# Patient Record
Sex: Male | Born: 1950 | Marital: Married | Smoking: Former smoker
Health system: Southern US, Community
[De-identification: ages and names within clinical notes are randomized; demographics above are authoritative.]

## PROBLEM LIST (undated history)

## (undated) DIAGNOSIS — N182 Chronic kidney disease, stage 2 (mild): Secondary | ICD-10-CM

## (undated) DIAGNOSIS — K420 Umbilical hernia with obstruction, without gangrene: Secondary | ICD-10-CM

## (undated) DIAGNOSIS — I10 Essential (primary) hypertension: Secondary | ICD-10-CM

## (undated) DIAGNOSIS — E119 Type 2 diabetes mellitus without complications: Secondary | ICD-10-CM

## (undated) DIAGNOSIS — J45909 Unspecified asthma, uncomplicated: Secondary | ICD-10-CM

## (undated) HISTORY — PX: INGUINAL HERNIA REPAIR: SUR1180

## (undated) HISTORY — DX: Chronic kidney disease, stage 2 (mild): N18.2

## (undated) HISTORY — DX: Umbilical hernia with obstruction, without gangrene: K42.0

## (undated) HISTORY — PX: HERNIA REPAIR: SHX51

## (undated) HISTORY — DX: Type 2 diabetes mellitus without complications: E11.9

## (undated) HISTORY — DX: Essential (primary) hypertension: I10

---

## 2006-02-03 ENCOUNTER — Inpatient Hospital Stay: Payer: Self-pay | Admitting: Internal Medicine

## 2006-02-03 ENCOUNTER — Other Ambulatory Visit: Payer: Self-pay

## 2011-02-04 ENCOUNTER — Emergency Department: Payer: Self-pay | Admitting: Emergency Medicine

## 2013-10-08 ENCOUNTER — Emergency Department: Payer: Self-pay | Admitting: Emergency Medicine

## 2014-11-13 ENCOUNTER — Emergency Department: Payer: Self-pay | Admitting: Emergency Medicine

## 2014-11-20 ENCOUNTER — Ambulatory Visit: Payer: Self-pay | Admitting: Podiatry

## 2015-05-23 ENCOUNTER — Other Ambulatory Visit: Payer: Self-pay | Admitting: Internal Medicine

## 2015-05-23 DIAGNOSIS — M25561 Pain in right knee: Secondary | ICD-10-CM

## 2015-05-25 ENCOUNTER — Ambulatory Visit
Admission: RE | Admit: 2015-05-25 | Discharge: 2015-05-25 | Disposition: A | Discharge status: Home / Self Care | Payer: No Typology Code available for payment source | Source: Ambulatory Visit | Attending: Internal Medicine | Admitting: Internal Medicine

## 2015-05-25 DIAGNOSIS — M25561 Pain in right knee: Secondary | ICD-10-CM

## 2015-09-04 ENCOUNTER — Telehealth: Payer: Self-pay | Admitting: Gastroenterology

## 2015-09-04 NOTE — Telephone Encounter (Signed)
Colonoscopy triage °

## 2015-09-07 NOTE — Telephone Encounter (Signed)
Phoned patient to schedule colonoscopy, no answer left message to contact office

## 2015-09-11 NOTE — Telephone Encounter (Signed)
Cant reach patient, mailed letter for patient to contact office

## 2015-09-18 ENCOUNTER — Telehealth: Payer: Self-pay | Admitting: Gastroenterology

## 2015-09-18 ENCOUNTER — Other Ambulatory Visit: Payer: Self-pay

## 2015-09-18 NOTE — Telephone Encounter (Signed)
Gastroenterology Pre-Procedure Review  Request Date: 10-09-2015 Requesting Physician: Dr.   PATIENT REVIEW QUESTIONS: The patient responded to the following health history questions as indicated:    1. Are you having any GI issues? no 2. Do you have a personal history of Polyps? no 3. Do you have a family history of Colon Cancer or Polyps? no 4. Diabetes Mellitus? no 5. Joint replacements in the past 12 months?no 6. Major health problems in the past 3 months?no 7. Any artificial heart valves, MVP, or defibrillator?no    MEDICATIONS & ALLERGIES:    Patient reports the following regarding taking any anticoagulation/antiplatelet therapy:   Plavix, Coumadin, Eliquis, Xarelto, Lovenox, Pradaxa, Brilinta, or Effient? no Aspirin? YES  Patient confirms/reports the following medications:  No current outpatient prescriptions on file.   No current facility-administered medications for this visit.    Patient confirms/reports the following allergies:  Allergies not on file  No orders of the defined types were placed in this encounter.    AUTHORIZATION INFORMATION Primary Insurance: 1D#: Group #:  Secondary Insurance: 1D#: Group #:  SCHEDULE INFORMATION: Date: 10-09-2015 Time: Location:[facility]

## 2015-09-19 ENCOUNTER — Other Ambulatory Visit: Payer: Self-pay | Admitting: Internal Medicine

## 2015-09-19 ENCOUNTER — Ambulatory Visit
Admission: RE | Admit: 2015-09-19 | Discharge: 2015-09-19 | Disposition: A | Discharge status: Home / Self Care | Payer: No Typology Code available for payment source | Source: Ambulatory Visit | Attending: Internal Medicine | Admitting: Internal Medicine

## 2015-09-19 DIAGNOSIS — M79622 Pain in left upper arm: Secondary | ICD-10-CM | POA: Diagnosis present

## 2015-09-19 DIAGNOSIS — R52 Pain, unspecified: Secondary | ICD-10-CM

## 2015-09-19 DIAGNOSIS — W19XXXA Unspecified fall, initial encounter: Secondary | ICD-10-CM | POA: Diagnosis not present

## 2015-09-19 DIAGNOSIS — M25512 Pain in left shoulder: Secondary | ICD-10-CM | POA: Insufficient documentation

## 2015-09-27 ENCOUNTER — Other Ambulatory Visit: Payer: Self-pay

## 2015-09-27 DIAGNOSIS — Z1211 Encounter for screening for malignant neoplasm of colon: Secondary | ICD-10-CM

## 2015-09-27 MED ORDER — PEG 3350-KCL-NA BICARB-NACL 420 G PO SOLR: 4000.0000 mL | ORAL | Status: DC

## 2015-10-09 ENCOUNTER — Encounter: Payer: Self-pay | Admitting: Anesthesiology

## 2015-10-09 ENCOUNTER — Encounter
Admission: RE | Disposition: A | Discharge status: Home / Self Care | Payer: Self-pay | Source: Ambulatory Visit | Attending: Gastroenterology

## 2015-10-09 ENCOUNTER — Ambulatory Visit: Payer: No Typology Code available for payment source | Admitting: Anesthesiology

## 2015-10-09 ENCOUNTER — Ambulatory Visit
Admission: RE | Admit: 2015-10-09 | Discharge: 2015-10-09 | Disposition: A | Discharge status: Home / Self Care | Payer: No Typology Code available for payment source | Source: Ambulatory Visit | Attending: Gastroenterology | Admitting: Gastroenterology

## 2015-10-09 DIAGNOSIS — Z87891 Personal history of nicotine dependence: Secondary | ICD-10-CM | POA: Diagnosis not present

## 2015-10-09 DIAGNOSIS — D125 Benign neoplasm of sigmoid colon: Secondary | ICD-10-CM | POA: Insufficient documentation

## 2015-10-09 DIAGNOSIS — K640 First degree hemorrhoids: Secondary | ICD-10-CM | POA: Diagnosis not present

## 2015-10-09 DIAGNOSIS — Z1211 Encounter for screening for malignant neoplasm of colon: Secondary | ICD-10-CM | POA: Insufficient documentation

## 2015-10-09 HISTORY — PX: COLONOSCOPY WITH PROPOFOL: SHX5780

## 2015-10-09 SURGERY — COLONOSCOPY WITH PROPOFOL
Anesthesia: General

## 2015-10-09 MED ORDER — SODIUM CHLORIDE 0.9 % IV SOLN
INTRAVENOUS | Status: DC
  Administered 2015-10-09: 1000 mL via INTRAVENOUS

## 2015-10-09 MED ORDER — MIDAZOLAM HCL 2 MG/2ML IJ SOLN
INTRAMUSCULAR | Status: DC | PRN
  Administered 2015-10-09: 2 mg via INTRAVENOUS

## 2015-10-09 MED ORDER — PROPOFOL 10 MG/ML IV BOLUS
INTRAVENOUS | Status: DC | PRN
  Administered 2015-10-09: 20 mg via INTRAVENOUS
  Administered 2015-10-09 (×2): 30 mg via INTRAVENOUS

## 2015-10-09 MED ORDER — PROPOFOL 500 MG/50ML IV EMUL
INTRAVENOUS | Status: DC | PRN
  Administered 2015-10-09: 150 ug/kg/min via INTRAVENOUS

## 2015-10-09 MED ORDER — LIDOCAINE HCL (CARDIAC) 20 MG/ML IV SOLN
INTRAVENOUS | Status: DC | PRN
  Administered 2015-10-09: 60 mg via INTRAVENOUS

## 2015-10-09 NOTE — Anesthesia Postprocedure Evaluation (Signed)
Anesthesia Post Note  Patient: Jason Hammond  Procedure(s) Performed: Procedure(s) (LRB): COLONOSCOPY WITH PROPOFOL (N/A)  Patient location during evaluation: Endoscopy Anesthesia Type: General Level of consciousness: awake Pain management: pain level controlled Respiratory status: spontaneous breathing Cardiovascular status: blood pressure returned to baseline Anesthetic complications: no    Last Vitals:  Filed Vitals:   10/09/15 1029 10/09/15 1039  BP: 133/93 151/96  Pulse: 81 79  Temp:    Resp: 19 25    Last Pain:  Filed Vitals:   10/09/15 1040  PainSc: 0-No pain                 Jason Hammond S

## 2015-10-09 NOTE — Op Note (Signed)
[facility]astroenterology Patient Name: Jason Hammond Procedure Date: 10/09/2015 9:46 AM MRN: [id number] Account #: [id number] Date of Birth: 1951-08-13 Admit Type: Outpatient Age: 64 Room: [facility] ENDO ROOM 4 Gender: Male Note Status: Finalized Procedure:         Colonoscopy Indications:       Screening for colorectal malignant neoplasm Providers:         Lucilla Lame, MD Referring MD:      Casilda Carls, MD (Referring MD) Medicines:         Propofol per Anesthesia Complications:     No immediate complications. Procedure:         Pre-Anesthesia Assessment:                    - Prior to the procedure, a History and Physical was                     performed, and patient medications and allergies were                     reviewed. The patient's tolerance of previous anesthesia                     was also reviewed. The risks and benefits of the procedure                     and the sedation options and risks were discussed with the                     patient. All questions were answered, and informed consent                     was obtained. Prior Anticoagulants: The patient has taken                     no previous anticoagulant or antiplatelet agents. ASA                     Grade Assessment: II - A patient with mild systemic                     disease. After reviewing the risks and benefits, the                     patient was deemed in satisfactory condition to undergo                     the procedure.                    After obtaining informed consent, the colonoscope was                     passed under direct vision. Throughout the procedure, the                     patient's blood pressure, pulse, and oxygen saturations                     were monitored continuously. The Colonoscope was                     introduced through the anus and advanced to the the cecum,  identified by appendiceal orifice and ileocecal valve. The               colonoscopy was performed without difficulty. The patient                     tolerated the procedure well. The quality of the bowel                     preparation was good. Findings:      The perianal and digital rectal examinations were normal.      A 7 mm polyp was found in the sigmoid colon. The polyp was sessile. The       polyp was removed with a cold snare. Resection and retrieval were       complete.      Non-bleeding internal hemorrhoids were found during retroflexion. The       hemorrhoids were Grade II (internal hemorrhoids that prolapse but reduce       spontaneously). Impression:        - One 7 mm polyp in the sigmoid colon. Resected and                     retrieved.                    - Non-bleeding internal hemorrhoids. Recommendation:    - Await pathology results.                    - Repeat colonoscopy in 5 years if polyp adenoma and 10                     years if hyperplastic Procedure Code(s): --- Professional ---                    [phone], Colonoscopy, flexible; with removal of tumor(s),                     polyp(s), or other lesion(s) by snare technique Diagnosis Code(s): --- Professional ---                    Z12.11, Encounter for screening for malignant neoplasm of                     colon                    D12.5, Benign neoplasm of sigmoid colon CPT copyright 2014 American [employer] Association. All rights reserved. The codes documented in this report are preliminary and upon coder review may  be revised to meet current compliance requirements. Lucilla Lame, MD 10/09/2015 10:04:17 AM This report has been signed electronically. Number of Addenda: 0 Note Initiated On: 10/09/2015 9:46 AM Scope Withdrawal Time: 0 hours 7 minutes 38 seconds  Total Procedure Duration: 0 hours 10 minutes 28 seconds       [facility]

## 2015-10-09 NOTE — H&P (Signed)
  [facility]  [address] [place], [place] [address] Phone: [phone] Fax : [phone]  Primary Care Physician:  Casilda Carls, MD Primary Gastroenterologist:  Dr. Allen Norris  Pre-Procedure History & Physical: HPI:  Jason Hammond is a 64 y.o. male is here for a screening colonoscopy.   History reviewed. No pertinent past medical history.  Past Surgical History  Procedure Laterality Date  . Hernia repair      Prior to Admission medications   Medication Sig Start Date End Date Taking? Authorizing Provider  polyethylene glycol-electrolytes (TRILYTE) 420 G solution Take 4,000 mLs by mouth as directed. Drink one 8 oz glass every 30 mins until stools run clear. 09/27/15   Lucilla Lame, MD    Allergies as of 09/18/2015  . (Not on File)    History reviewed. No pertinent family history.  Social History   Social History  . Marital Status: Married    Spouse Name: N/A  . Number of Children: N/A  . Years of Education: N/A   Occupational History  . Not on file.   Social History Main Topics  . Smoking status: Former Smoker -- 15 years    Types: Cigarettes  . Smokeless tobacco: Never Used  . Alcohol Use: Yes  . Drug Use: No  . Sexual Activity: Not on file   Other Topics Concern  . Not on file   Social History Narrative  . No narrative on file    Review of Systems: See HPI, otherwise negative ROS  Physical Exam: BP 148/91 mmHg  Pulse 78  Temp(Src) 97.7 F (36.5 C) (Tympanic)  Resp 18  Ht 5\' 8"  (1.727 m)  Wt 171 lb (77.565 kg)  BMI 26.01 kg/m2  SpO2 97% General:   Alert,  pleasant and cooperative in NAD Head:  Normocephalic and atraumatic. Neck:  Supple; no masses or thyromegaly. Lungs:  Clear throughout to auscultation.    Heart:  Regular rate and rhythm. Abdomen:  Soft, nontender and nondistended. Normal bowel sounds, without guarding, and without rebound.   Neurologic:  Alert and  oriented x4;  grossly normal  neurologically.  Impression/Plan: Jason Hammond is now here to undergo a screening colonoscopy.  Risks, benefits, and alternatives regarding colonoscopy have been reviewed with the patient.  Questions have been answered.  All parties agreeable.

## 2015-10-09 NOTE — Transfer of Care (Signed)
Immediate Anesthesia Transfer of Care Note  Patient: Jason Hammond  Procedure(s) Performed: Procedure(s): COLONOSCOPY WITH PROPOFOL (N/A)  Patient Location: PACU  Anesthesia Type:MAC  Level of Consciousness: awake and sedated  Airway & Oxygen Therapy: Patient Spontanous Breathing  Post-op Assessment: Report given to RN  Post vital signs: stable  Last Vitals:  Filed Vitals:   10/09/15 0834 10/09/15 1000  BP: 148/91   Pulse: 78 85  Temp: 36.5 C 35.7 C  Resp: 18 28    Complications: No apparent anesthesia complications

## 2015-10-09 NOTE — Anesthesia Preprocedure Evaluation (Signed)
Anesthesia Evaluation  Patient identified by MRN, date of birth, ID band Patient awake    Reviewed: Allergy & Precautions, NPO status , Patient's Chart, lab work & pertinent test results, reviewed documented beta blocker date and time   Airway Mallampati: II  TM Distance: >3 FB     Dental  (+) Chipped   Pulmonary           Cardiovascular      Neuro/Psych    GI/Hepatic   Endo/Other    Renal/GU      Musculoskeletal   Abdominal   Peds  Hematology   Anesthesia Other Findings   Reproductive/Obstetrics                             Anesthesia Physical Anesthesia Plan  ASA: II  Anesthesia Plan: General   Post-op Pain Management:    Induction: Intravenous  Airway Management Planned:   Additional Equipment:   Intra-op Plan:   Post-operative Plan:   Informed Consent: I have reviewed the patients History and Physical, chart, labs and discussed the procedure including the risks, benefits and alternatives for the proposed anesthesia with the patient or authorized representative who has indicated his/her understanding and acceptance.     Plan Discussed with: CRNA  Anesthesia Plan Comments:         Anesthesia Quick Evaluation  

## 2015-10-10 ENCOUNTER — Encounter: Payer: Self-pay | Admitting: Gastroenterology

## 2015-10-10 LAB — SURGICAL PATHOLOGY

## 2015-10-11 ENCOUNTER — Encounter: Payer: Self-pay | Admitting: Gastroenterology

## 2017-04-10 IMAGING — CR DG HUMERUS 2V *L*
1 series · 2 of 2 positions shown · non-contrast
Comparison: None.

CLINICAL DATA: Recent fall with left mid humerus pain.

EXAM:
LEFT HUMERUS - 2+ VIEW

[Series 1: dg humerus left · 0.14mm/px · 2 of 2 slices shown]
[im 1/2]
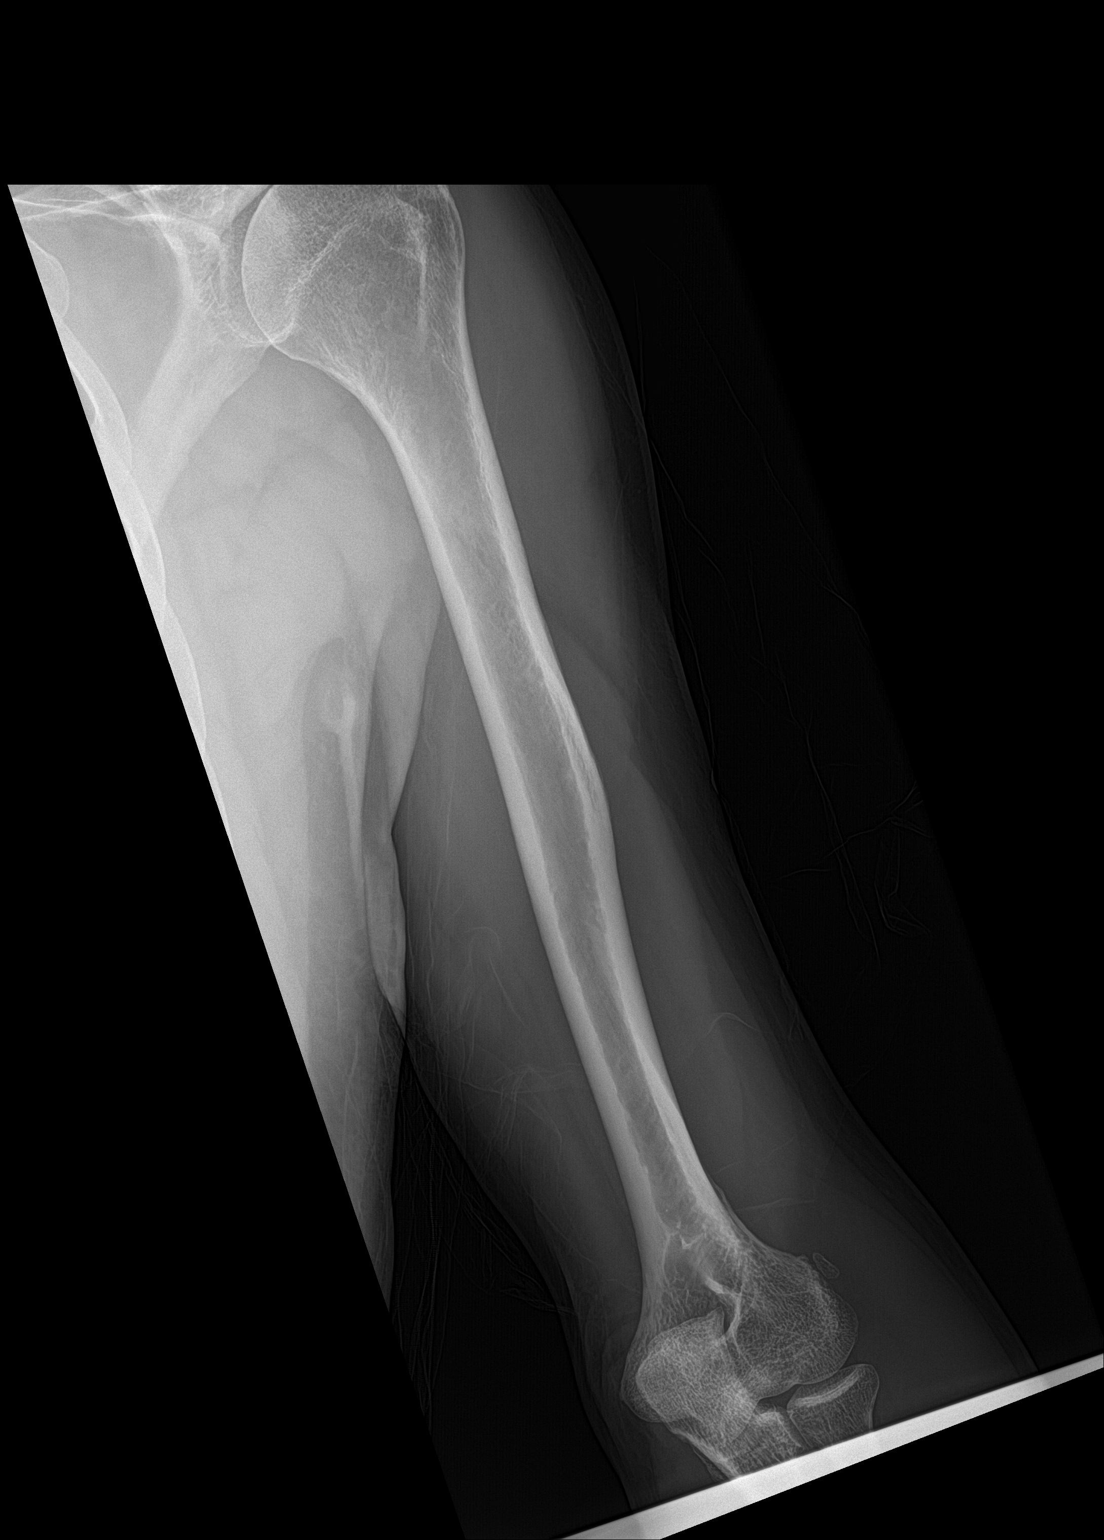
[im 2/2]
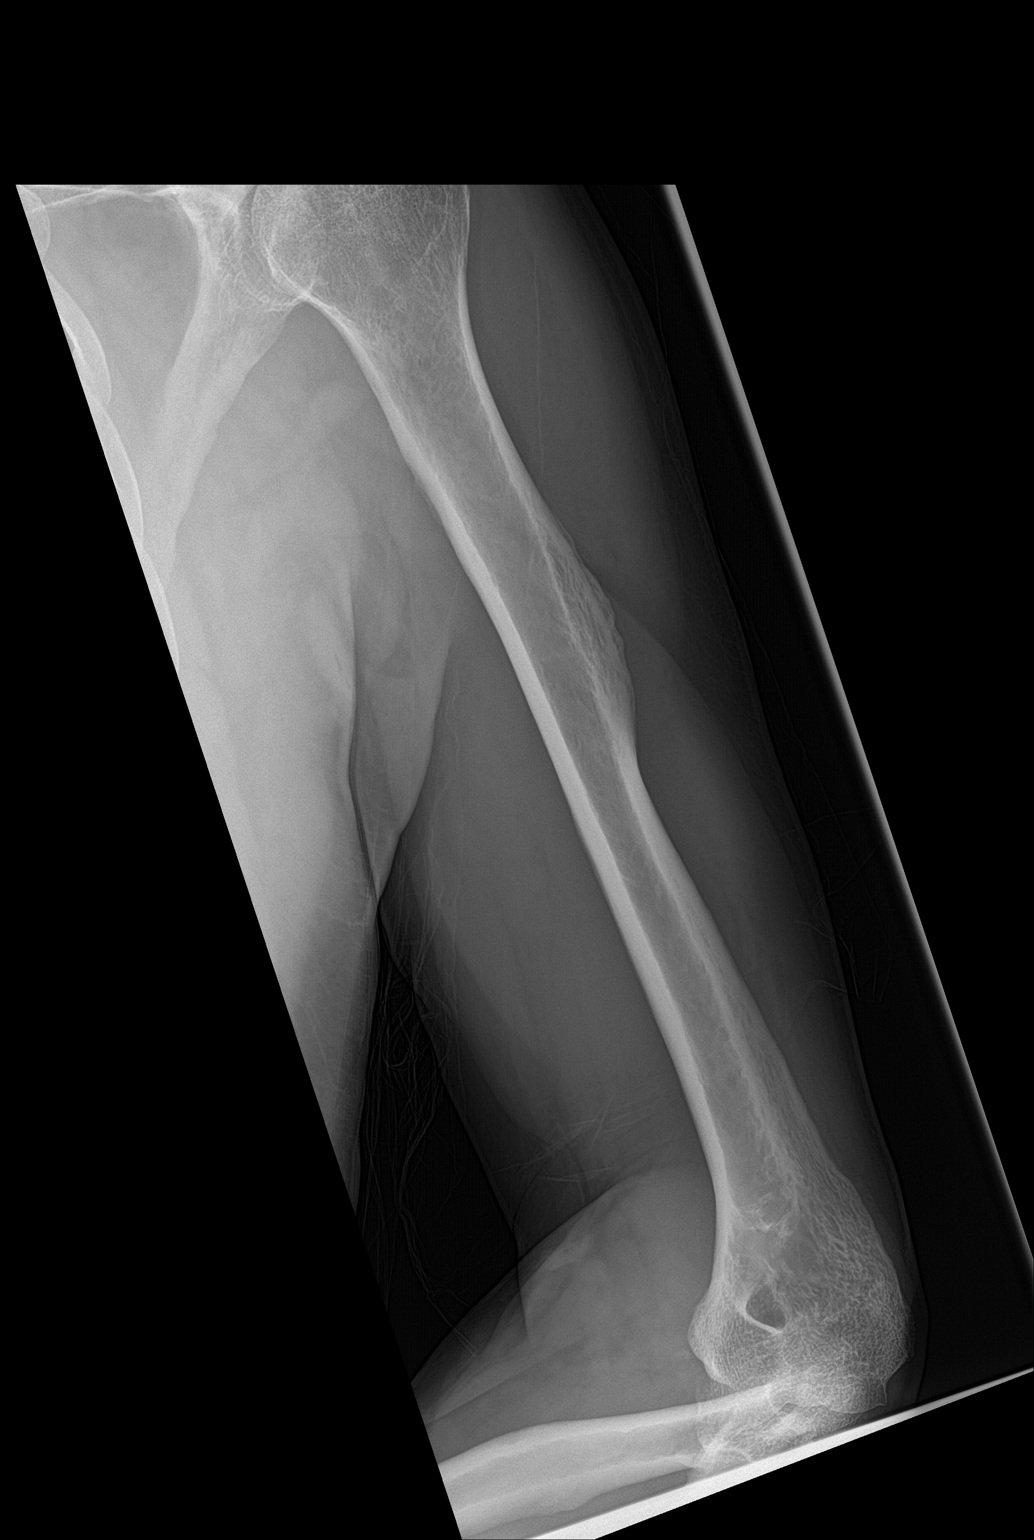

[2 of 2 positions shown; findings below may reference images not displayed]

FINDINGS: There is no evidence of fracture or other focal bone lesions. Soft
tissues are unremarkable.
IMPRESSION: Negative.

## 2018-10-27 DIAGNOSIS — U071 COVID-19: Secondary | ICD-10-CM

## 2018-10-27 HISTORY — DX: COVID-19: U07.1

## 2022-05-13 ENCOUNTER — Other Ambulatory Visit: Payer: Self-pay | Admitting: Internal Medicine

## 2022-05-13 DIAGNOSIS — R079 Chest pain, unspecified: Secondary | ICD-10-CM

## 2022-05-13 DIAGNOSIS — R0602 Shortness of breath: Secondary | ICD-10-CM

## 2022-05-21 ENCOUNTER — Ambulatory Visit: Payer: Medicare Other | Attending: Internal Medicine

## 2022-05-21 DIAGNOSIS — R079 Chest pain, unspecified: Secondary | ICD-10-CM | POA: Diagnosis not present

## 2022-05-21 DIAGNOSIS — R0602 Shortness of breath: Secondary | ICD-10-CM

## 2022-05-21 MED ORDER — ALBUTEROL SULFATE (2.5 MG/3ML) 0.083% IN NEBU
2.5000 mg | INHALATION_SOLUTION | Freq: Once | RESPIRATORY_TRACT | Status: AC
  Administered 2022-05-21: 2.5 mg via RESPIRATORY_TRACT

## 2022-06-10 ENCOUNTER — Other Ambulatory Visit: Payer: Self-pay | Admitting: Internal Medicine

## 2022-06-10 DIAGNOSIS — R42 Dizziness and giddiness: Secondary | ICD-10-CM

## 2022-06-19 ENCOUNTER — Ambulatory Visit
Admission: RE | Admit: 2022-06-19 | Discharge: 2022-06-19 | Disposition: A | Discharge status: Home / Self Care | Payer: Medicare Other | Source: Ambulatory Visit | Attending: Internal Medicine | Admitting: Internal Medicine

## 2022-06-19 DIAGNOSIS — R42 Dizziness and giddiness: Secondary | ICD-10-CM | POA: Insufficient documentation

## 2023-04-16 ENCOUNTER — Encounter: Payer: Self-pay | Admitting: *Deleted

## 2023-04-16 ENCOUNTER — Other Ambulatory Visit: Payer: Self-pay

## 2023-04-16 DIAGNOSIS — N189 Chronic kidney disease, unspecified: Secondary | ICD-10-CM | POA: Insufficient documentation

## 2023-04-16 DIAGNOSIS — K420 Umbilical hernia with obstruction, without gangrene: Secondary | ICD-10-CM | POA: Insufficient documentation

## 2023-04-16 DIAGNOSIS — I129 Hypertensive chronic kidney disease with stage 1 through stage 4 chronic kidney disease, or unspecified chronic kidney disease: Secondary | ICD-10-CM | POA: Insufficient documentation

## 2023-04-16 DIAGNOSIS — E1122 Type 2 diabetes mellitus with diabetic chronic kidney disease: Secondary | ICD-10-CM | POA: Diagnosis not present

## 2023-04-16 DIAGNOSIS — K429 Umbilical hernia without obstruction or gangrene: Secondary | ICD-10-CM | POA: Diagnosis present

## 2023-04-16 LAB — URINALYSIS, ROUTINE W REFLEX MICROSCOPIC
Bilirubin Urine: NEGATIVE
Glucose, UA: 50 mg/dL — AB
Hgb urine dipstick: NEGATIVE
Ketones, ur: NEGATIVE mg/dL
Leukocytes,Ua: NEGATIVE
Nitrite: NEGATIVE
Protein, ur: NEGATIVE mg/dL
Specific Gravity, Urine: 1.016 (ref 1.005–1.030)
pH: 5 (ref 5.0–8.0)

## 2023-04-16 LAB — CBC
HCT: 44.9 % (ref 39.0–52.0)
Hemoglobin: 14.7 g/dL (ref 13.0–17.0)
MCH: 31.5 pg (ref 26.0–34.0)
MCHC: 32.7 g/dL (ref 30.0–36.0)
MCV: 96.4 fL (ref 80.0–100.0)
Platelets: 219 10*3/uL (ref 150–400)
RBC: 4.66 MIL/uL (ref 4.22–5.81)
RDW: 12.1 % (ref 11.5–15.5)
WBC: 9.5 10*3/uL (ref 4.0–10.5)
nRBC: 0 % (ref 0.0–0.2)

## 2023-04-16 LAB — BASIC METABOLIC PANEL
Anion gap: 12 (ref 5–15)
BUN: 19 mg/dL (ref 8–23)
CO2: 23 mmol/L (ref 22–32)
Calcium: 9.1 mg/dL (ref 8.9–10.3)
Chloride: 101 mmol/L (ref 98–111)
Creatinine, Ser: 1.01 mg/dL (ref 0.61–1.24)
GFR, Estimated: >60 mL/min (ref >60)
Glucose, Bld: 205 mg/dL — ABNORMAL HIGH (ref 70–99)
Potassium: 3.9 mmol/L (ref 3.5–5.1)
Sodium: 136 mmol/L (ref 135–145)

## 2023-04-16 NOTE — ED Triage Notes (Signed)
Pt reports he was pulling a dog fence up.  Pt now has umbilical hernia, with a blister on umbilicus.  Pt reports abd pain.  Pt alert.  Speech clear.

## 2023-04-17 ENCOUNTER — Emergency Department: Payer: Medicare HMO

## 2023-04-17 ENCOUNTER — Emergency Department
Admission: EM | Admit: 2023-04-17 | Discharge: 2023-04-17 | Disposition: A | Discharge status: Home / Self Care | Payer: Medicare HMO | Attending: Emergency Medicine | Admitting: Emergency Medicine

## 2023-04-17 DIAGNOSIS — K420 Umbilical hernia with obstruction, without gangrene: Secondary | ICD-10-CM

## 2023-04-17 MED ORDER — FENTANYL CITRATE PF 50 MCG/ML IJ SOSY
75.0000 ug | PREFILLED_SYRINGE | Freq: Once | INTRAMUSCULAR | Status: AC
  Administered 2023-04-17: 75 ug via INTRAVENOUS
  Filled 2023-04-17: qty 2

## 2023-04-17 MED ORDER — IOHEXOL 300 MG/ML  SOLN
100.0000 mL | Freq: Once | INTRAMUSCULAR | Status: AC | PRN
  Administered 2023-04-17: 100 mL via INTRAVENOUS

## 2023-04-17 NOTE — Discharge Instructions (Addendum)
Please use ibuprofen (Motrin) up to 800 mg every 8 hours, naproxen (Naprosyn) up to 500 mg every 12 hours, and/or acetaminophen (Tylenol) up to 4 g/day for any continued pain.  Please do not use this medication regimen for longer than 7 days 

## 2023-04-17 NOTE — ED Notes (Signed)
Abdominal binder placed on pt and shown how to remove and place binder, pt understanding of abdominal binder use at this time,.

## 2023-04-17 NOTE — ED Provider Notes (Signed)
[facility] Provider Note    Event Date/Time   First MD Initiated Contact with Patient 04/17/23 [phone]     (approximate)   History   Hernia   HPI  Jason Hammond is a 72 y.o. male who presents to the ED for evaluation of Hernia   I reviewed nephrology clinic visit from January.  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  He was pulling an old fence post out of the ground yesterday when he developed pain to this area and reports new skin changes.  Denies any actual direct trauma or striking the hernia with the fence posts or any other direct contact.  Persistent discomfort since that time.  No emesis   Physical Exam   Triage Vital Signs: ED Triage Vitals  Enc Vitals Group     BP 04/16/23 2052 139/74     Pulse Rate 04/16/23 2052 99     Resp 04/16/23 2052 18     Temp 04/16/23 2052 97.7 F (36.5 C)     Temp Source 04/16/23 2052 Oral     SpO2 04/16/23 2052 95 %     Weight 04/16/23 2050 170 lb (77.1 kg)     Height 04/16/23 2050 5\' 8"  (1.727 m)     Head Circumference --      Peak Flow --      Pain Score 04/16/23 2050 6     Pain Loc --      Pain Edu? --      Excl. in GC? --     Most recent vital signs: Vitals:   04/16/23 2052 04/17/23 0600  BP: 139/74 (!) 143/94  Pulse: 99 78  Resp: 18   Temp: 97.7 F (36.5 C)   SpO2: 95% 99%    General: Awake, no distress.  CV:  Good peripheral perfusion.  Resp:  Normal effort.  Abd:  No distention.  Umbilical hernia, as pictured below is tender to palpation with an overlying bulla.  Firm and tender. MSK:  No deformity noted.  Neuro:  No focal deficits appreciated. Other:     ED Results / Procedures / Treatments   Labs (all labs ordered are listed, but only abnormal results are displayed) Labs Reviewed  BASIC METABOLIC PANEL - Abnormal; Notable for the following components:      Result Value   Glucose, Bld 205 (*)    All other components  within normal limits  URINALYSIS, ROUTINE W REFLEX MICROSCOPIC - Abnormal; Notable for the following components:   Color, Urine YELLOW (*)    APPearance CLEAR (*)    Glucose, UA 50 (*)    All other components within normal limits  CBC    EKG   RADIOLOGY CT abdomen/pelvis interpreted by me with small fat-containing umbilical hernia remaining  Official radiology report(s): CT ABDOMEN PELVIS W CONTRAST  Result Date: 04/17/2023 CLINICAL DATA:  Incarceration of umbilical hernia. EXAM: CT ABDOMEN AND PELVIS WITH CONTRAST TECHNIQUE: Multidetector CT imaging of the abdomen and pelvis was performed using the standard protocol following bolus administration of intravenous contrast. RADIATION DOSE REDUCTION: This exam was performed according to the departmental dose-optimization program which includes automated exposure control, adjustment of the mA and/or kV according to patient size and/or use of iterative reconstruction technique. CONTRAST:  OMNIPAQUE IOHEXOL 300 MG/ML  SOLN COMPARISON:  None Available. FINDINGS: Lower chest: Circumferential bronchial wall thickening noted in the lower lobes bilaterally. Hepatobiliary: No suspicious focal abnormality  within the liver parenchyma. There is no evidence for gallstones, gallbladder wall thickening, or pericholecystic fluid. No intrahepatic or extrahepatic biliary dilation. Pancreas: No focal mass lesion. No dilatation of the main duct. No intraparenchymal cyst. No peripancreatic edema. Spleen: No splenomegaly. No suspicious focal mass lesion. Adrenals/Urinary Tract: No adrenal nodule or mass. Tiny well-defined homogeneous low-density lesions in both kidneys are too small to characterize but are statistically most likely benign and probably cysts. No followup imaging is recommended. No evidence for hydroureter. The urinary bladder appears normal for the degree of distention. Stomach/Bowel: Stomach is unremarkable. No gastric wall thickening. No evidence of  outlet obstruction. Duodenum is normally positioned as is the ligament of Treitz. No small bowel wall thickening. No small bowel dilatation. The terminal ileum is normal. The appendix is normal. No gross colonic mass. No colonic wall thickening. Vascular/Lymphatic: There is mild atherosclerotic calcification of the abdominal aorta without aneurysm. There is no gastrohepatic or hepatoduodenal ligament lymphadenopathy. No retroperitoneal or mesenteric lymphadenopathy. No pelvic sidewall lymphadenopathy. Reproductive: The prostate gland and seminal vesicles are unremarkable. Other: No intraperitoneal free fluid. Musculoskeletal: Small umbilical hernia contains only preperitoneal/omental fat. Hernia sac measures 2.7 x 3.4 x 1.7 cm. Underlying fascial defect is approximately 1.1 x 0.9 cm. No edema or fluid in the hernia sac to suggest fat incarceration. Small left groin hernia contains only fat. No worrisome lytic or sclerotic osseous abnormality. IMPRESSION: 1. Small umbilical hernia contains only preperitoneal/omental fat. No edema or fluid in the hernia sac to suggest fat incarceration. 2. Small left groin hernia contains only fat. 3. Circumferential bronchial wall thickening in the lower lobes bilaterally. Imaging features suggest bronchitis. 4.  Aortic Atherosclerosis (ICD10-I70.0). Electronically Signed   By: Kennith Center M.D.   On: 04/17/2023 06:41    PROCEDURES and INTERVENTIONS:  Procedures  Medications  fentaNYL (SUBLIMAZE) injection 75 mcg (75 mcg Intravenous Given 04/17/23 0549)  iohexol (OMNIPAQUE) 300 MG/ML solution 100 mL (100 mLs Intravenous Contrast Given 04/17/23 0619)     IMPRESSION / MDM / ASSESSMENT AND PLAN / ED COURSE  I reviewed the triage vital signs and the nursing notes.  Differential diagnosis includes, but is not limited to, incarcerated hernia, ischemic bowel, SBO  {Patient presents with symptoms of an acute illness or injury that is potentially life-threatening.  Patient  presents with evidence of incarcerated local hernia that I can reduce the bedside.  Subsequent CT with small amount of remaining fat but clinically his pain is better and the area is soft.  Blood work is reassuring with a normal CBC.  Metabolic panel hyperglycemia without acidosis.  Urine without infectious features.  He asked that we reach out to surgery to discuss options.  We will talk to the morning surgery team  Clinical Course as of 04/17/23 0654  Fri Apr 17, 2023  0602 Reduced, I believe.  We will CT [DS]    Clinical Course User Index [DS] Delton Prairie, MD     FINAL CLINICAL IMPRESSION(S) / ED DIAGNOSES   Final diagnoses:  Incarcerated umbilical hernia     Rx / DC Orders   ED Discharge Orders     None        Note:  This document was prepared using Dragon voice recognition software and may include unintentional dictation errors.   Delton Prairie, MD 04/17/23 [phone]

## 2023-04-17 NOTE — ED Provider Notes (Signed)
Emergency department handoff note  Care of this patient was signed out to me at the end of the previous provider shift.  All pertinent patient information was conveyed and all questions were answered.  Patient pending surgical evaluation.  I spoke to Dr. Aleen Campi in surgery who knows patient's wife as well and will see patient in follow-up given that patient is out of pain at this time and there is no evidence of abdominal contents in this umbilical hernia on CT. The patient has been reexamined and is ready to be discharged.  All diagnostic results have been reviewed and discussed with the patient/family.  Care plan has been outlined and the patient/family understands all current diagnoses, results, and treatment plans.  There are no new complaints, changes, or physical findings at this time.  All questions have been addressed and answered.  Patient was instructed to, and agrees to follow-up with their primary care physician as well as return to the emergency department if any new or worsening symptoms develop.   Merwyn Katos, MD 04/17/23 [phone]

## 2023-04-24 ENCOUNTER — Ambulatory Visit (INDEPENDENT_AMBULATORY_CARE_PROVIDER_SITE_OTHER): Payer: Medicare HMO | Admitting: Surgery

## 2023-04-24 ENCOUNTER — Encounter: Payer: Self-pay | Admitting: Surgery

## 2023-04-24 ENCOUNTER — Telehealth: Payer: Self-pay

## 2023-04-24 VITALS — BP 138/87 | HR 88 | Temp 98.3°F | Ht 68.0 in | Wt 172.6 lb

## 2023-04-24 DIAGNOSIS — K420 Umbilical hernia with obstruction, without gangrene: Secondary | ICD-10-CM

## 2023-04-24 NOTE — Progress Notes (Signed)
04/24/2023  Reason for Visit:  Incarcerated umbilical hernia  History of Present Illness: Jason Hammond is a 72 y.o. male presenting for evaluation of an incarcerated umbilical hernia.  The patient has a history of an umbilical hernia in the past which had not been bothering him much at all.  He had been careful to avoid strenuous activity or to push on the hernia area if anything straining.  However, he reports last week he was working on clearing some fences at his house and forgot to be careful when pulling on some heavy things.  After that he noticed more tenderness at the umbilicus and some blistering with drainage coming from it.  He presented to the emergency room.  In the ER, his workup showed a WBC of 9.5.  The ER provider was able to partially push his hernia back in and follow up CT scan showed an umbilical hernia containing fat, without stranding.  It also showed a left inguinal hernia containing fat.  Since his discharge from the ER, he has been wearing an abdominal binder and reports that it can get uncomfortable due to the heat and some skin irritation.  Nonetheless, he has not had any worsening symptoms so far.  Past Medical History: Past Medical History:  Diagnosis Date   CKD (chronic kidney disease) stage 2, GFR 60-89 ml/min    Diabetes mellitus (HCC)    Hypertension    Incarcerated umbilical hernia      Past Surgical History: Past Surgical History:  Procedure Laterality Date   COLONOSCOPY WITH PROPOFOL N/A 10/09/2015   Procedure: COLONOSCOPY WITH PROPOFOL;  Surgeon: Midge Minium, MD;  Location: [facility] ENDOSCOPY;  Service: Endoscopy;  Laterality: N/A;   INGUINAL HERNIA REPAIR Left     Home Medications: Prior to Admission medications   Medication Sig Start Date End Date Taking? Authorizing Provider  aspirin EC 81 MG tablet Take 1 tablet every day by oral route.   Yes [provider]  Fenofibrate 40 MG TABS Take 2 tablets every day by oral route.   Yes [provider]  JARDIANCE 10 MG TABS tablet Take 10 mg by mouth daily.   Yes [provider]  losartan (COZAAR) 25 MG tablet Take 25 mg by mouth 2 (two) times daily.   Yes [provider]  metFORMIN (GLUCOPHAGE) 1000 MG tablet Take 1,000 mg by mouth 2 (two) times daily.   Yes [provider]    Allergies: No Known Allergies  Social History:  reports that he has quit smoking. His smoking use included cigarettes. He has never used smokeless tobacco. He reports current alcohol use. He reports that he does not use drugs.   Family History: History reviewed. No pertinent family history.  Review of Systems: Review of Systems  Constitutional:  Negative for chills and fever.  HENT:  Negative for hearing loss.   Respiratory:  Negative for shortness of breath.   Cardiovascular:  Negative for chest pain.  Gastrointestinal:  Positive for abdominal pain. Negative for nausea and vomiting.  Genitourinary:  Negative for dysuria.  Musculoskeletal:  Negative for myalgias.  Skin:  Negative for rash.  Neurological:  Negative for dizziness.  Psychiatric/Behavioral:  Negative for depression.     Physical Exam BP 138/87   Pulse 88   Temp 98.3 F (36.8 C) (Oral)   Ht 5\' 8"  (1.727 m)   Wt 172 lb 9.6 oz (78.3 kg)   SpO2 96%   BMI 26.24 kg/m  CONSTITUTIONAL: No acute distress, well nourished.  HEENT:  Normocephalic, atraumatic, extraocular motion intact. NECK: Trachea is midline, and there is no jugular venous distension.  RESPIRATORY:  Lungs are clear, and breath sounds are equal bilaterally. Normal respiratory effort without pathologic use of accessory muscles. CARDIOVASCULAR: Heart is regular without murmurs, gallops, or rubs. GI: The abdomen is soft, non-distended, with mild tenderness to palpation when pushing on his umbilical hernia.  The hernia is partially reducible, but otherwise has an incarcerated component.  It is tender when trying to push it more.  The  overlying skin is healed with evidence of prior blister.  No evidence of infection.  No evidence on exam of inguinal hernia recurrence on the left with coughing or straining.  MUSCULOSKELETAL:  Normal muscle strength and tone in all four extremities.  No peripheral edema or cyanosis. SKIN: Skin turgor is normal. There are no pathologic skin lesions.  NEUROLOGIC:  Motor and sensation is grossly normal.  Cranial nerves are grossly intact. PSYCH:  Alert and oriented to person, place and time. Affect is normal.  Laboratory Analysis: Labs on 04/16/23: Na 136, K 3.9, Cl 101, CO2 23, BUN 19, Cr 1.01.  WBC 9.5, Hgb 14.7, Hct 44.9, Plt 219.  Imaging: CT abdomen/pelvis on 04/17/23: IMPRESSION: 1. Small umbilical hernia contains only preperitoneal/omental fat. No edema or fluid in the hernia sac to suggest fat incarceration. 2. Small left groin hernia contains only fat. 3. Circumferential bronchial wall thickening in the lower lobes bilaterally. Imaging features suggest bronchitis. 4.  Aortic Atherosclerosis (ICD10-I70.0).  Assessment and Plan: This is a 72 y.o. male with an incarcerated umbilical hernia.  --Discussed with the patient the findings on his labs and CT scan.  He has had a prior left inguinal hernia repair but on exam there is no evidence of recurrence.  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  The patient denies any symptoms in the groin or any bulging sensation there.  However, he does have an incarcerated umbilical hernia.  The defect is about 1 cm in size, unable to fully reduce, with tenderness to deep palpation.  The patient is interested in surgical management so this past episode does not keep happening and he can go back to his usual lifestyle and physical activity. --Discussed with him then the options for surgical management between open repair or robotic hernia repair.  He has opted to proceed with minimally invasive robotic assisted  umbilical hernia repair.  Reviewed the surgery at length with him including the planned incisions, the risks of bleeding, infection, injury to surrounding structures, that this would be an outpatient surgery, post-operative activity restrictions, pain control, and he's willing to proceed. --Will schedule him for surgery on 05/05/23.  All of his questions have been answered.  Will also obtain medical clearance.  He is on Aspirin 81 mg daily, and have asked to hold his Aspirin 5 days prior to surgery.  I spent 60 minutes dedicated to the care of this patient on the date of this encounter to include pre-visit review of records, face-to-face time with the patient discussing diagnosis and management, and any post-visit coordination of care.   Howie Ill, MD [facility]

## 2023-04-24 NOTE — H&P (View-Only) (Signed)
04/24/2023  Reason for Visit:  Incarcerated umbilical hernia  History of Present Illness: Jason Hammond is a 72 y.o. male presenting for evaluation of an incarcerated umbilical hernia.  The patient has a history of an umbilical hernia in the past which had not been bothering him much at all.  He had been careful to avoid strenuous activity or to push on the hernia area if anything straining.  However, he reports last week he was working on clearing some fences at his house and forgot to be careful when pulling on some heavy things.  After that he noticed more tenderness at the umbilicus and some blistering with drainage coming from it.  He presented to the emergency room.  In the ER, his workup showed a WBC of 9.5.  The ER provider was able to partially push his hernia back in and follow up CT scan showed an umbilical hernia containing fat, without stranding.  It also showed a left inguinal hernia containing fat.  Since his discharge from the ER, he has been wearing an abdominal binder and reports that it can get uncomfortable due to the heat and some skin irritation.  Nonetheless, he has not had any worsening symptoms so far.  Past Medical History: Past Medical History:  Diagnosis Date   CKD (chronic kidney disease) stage 2, GFR 60-89 ml/min    Diabetes mellitus (HCC)    Hypertension    Incarcerated umbilical hernia      Past Surgical History: Past Surgical History:  Procedure Laterality Date   COLONOSCOPY WITH PROPOFOL N/A 10/09/2015   Procedure: COLONOSCOPY WITH PROPOFOL;  Surgeon: Darren Wohl, MD;  Location: [facility] ENDOSCOPY;  Service: Endoscopy;  Laterality: N/A;   INGUINAL HERNIA REPAIR Left     Home Medications: Prior to Admission medications   Medication Sig Start Date End Date Taking? Authorizing Provider  aspirin EC 81 MG tablet Take 1 tablet every day by oral route.   Yes [provider]  Fenofibrate 40 MG TABS Take 2 tablets every day by oral route.   Yes [provider]  JARDIANCE 10 MG TABS tablet Take 10 mg by mouth daily.   Yes [provider]  losartan (COZAAR) 25 MG tablet Take 25 mg by mouth 2 (two) times daily.   Yes [provider]  metFORMIN (GLUCOPHAGE) 1000 MG tablet Take 1,000 mg by mouth 2 (two) times daily.   Yes [provider]    Allergies: No Known Allergies  Social History:  reports that he has quit smoking. His smoking use included cigarettes. He has never used smokeless tobacco. He reports current alcohol use. He reports that he does not use drugs.   Family History: History reviewed. No pertinent family history.  Review of Systems: Review of Systems  Constitutional:  Negative for chills and fever.  HENT:  Negative for hearing loss.   Respiratory:  Negative for shortness of breath.   Cardiovascular:  Negative for chest pain.  Gastrointestinal:  Positive for abdominal pain. Negative for nausea and vomiting.  Genitourinary:  Negative for dysuria.  Musculoskeletal:  Negative for myalgias.  Skin:  Negative for rash.  Neurological:  Negative for dizziness.  Psychiatric/Behavioral:  Negative for depression.     Physical Exam BP 138/87   Pulse 88   Temp 98.3 F (36.8 C) (Oral)   Ht 5' 8" (1.727 m)   Wt 172 lb 9.6 oz (78.3 kg)   SpO2 96%   BMI 26.24 kg/m  CONSTITUTIONAL: No acute distress, well nourished.   HEENT:  Normocephalic, atraumatic, extraocular motion intact. NECK: Trachea is midline, and there is no jugular venous distension.  RESPIRATORY:  Lungs are clear, and breath sounds are equal bilaterally. Normal respiratory effort without pathologic use of accessory muscles. CARDIOVASCULAR: Heart is regular without murmurs, gallops, or rubs. GI: The abdomen is soft, non-distended, with mild tenderness to palpation when pushing on his umbilical hernia.  The hernia is partially reducible, but otherwise has an incarcerated component.  It is tender when trying to push it more.  The  overlying skin is healed with evidence of prior blister.  No evidence of infection.  No evidence on exam of inguinal hernia recurrence on the left with coughing or straining.  MUSCULOSKELETAL:  Normal muscle strength and tone in all four extremities.  No peripheral edema or cyanosis. SKIN: Skin turgor is normal. There are no pathologic skin lesions.  NEUROLOGIC:  Motor and sensation is grossly normal.  Cranial nerves are grossly intact. PSYCH:  Alert and oriented to person, place and time. Affect is normal.  Laboratory Analysis: Labs on 04/16/23: Na 136, K 3.9, Cl 101, CO2 23, BUN 19, Cr 1.01.  WBC 9.5, Hgb 14.7, Hct 44.9, Plt 219.  Imaging: CT abdomen/pelvis on 04/17/23: IMPRESSION: 1. Small umbilical hernia contains only preperitoneal/omental fat. No edema or fluid in the hernia sac to suggest fat incarceration. 2. Small left groin hernia contains only fat. 3. Circumferential bronchial wall thickening in the lower lobes bilaterally. Imaging features suggest bronchitis. 4.  Aortic Atherosclerosis (ICD10-I70.0).  Assessment and Plan: This is a 72 y.o. male with an incarcerated umbilical hernia.  --Discussed with the patient the findings on his labs and CT scan.  He has had a prior left inguinal hernia repair but on exam there is no evidence of recurrence.  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  The patient denies any symptoms in the groin or any bulging sensation there.  However, he does have an incarcerated umbilical hernia.  The defect is about 1 cm in size, unable to fully reduce, with tenderness to deep palpation.  The patient is interested in surgical management so this past episode does not keep happening and he can go back to his usual lifestyle and physical activity. --Discussed with him then the options for surgical management between open repair or robotic hernia repair.  He has opted to proceed with minimally invasive robotic assisted  umbilical hernia repair.  Reviewed the surgery at length with him including the planned incisions, the risks of bleeding, infection, injury to surrounding structures, that this would be an outpatient surgery, post-operative activity restrictions, pain control, and he's willing to proceed. --Will schedule him for surgery on 05/05/23.  All of his questions have been answered.  Will also obtain medical clearance.  He is on Aspirin 81 mg daily, and have asked to hold his Aspirin 5 days prior to surgery.  I spent 60 minutes dedicated to the care of this patient on the date of this encounter to include pre-visit review of records, face-to-face time with the patient discussing diagnosis and management, and any post-visit coordination of care.   Steffon Gladu Luis Harmani Neto, MD [facility]    

## 2023-04-24 NOTE — Patient Instructions (Signed)
Our surgery scheduler Barbara will call you within 24-48 hours to get you scheduled. If you have not heard from her after 48 hours, please call our office. Have the blue sheet available when she calls to write down important information.   If you have any concerns or questions, please feel free to call our office.   Umbilical Hernia, Adult  A hernia is a bulge of tissue that pushes through an opening between muscles. An umbilical hernia happens in the abdomen, near the belly button (umbilicus). The hernia [place] contain tissues from the small intestine, large intestine, or fatty tissue covering the intestines. Umbilical hernias in adults tend to get worse over time, and they require surgical treatment. There are different types of umbilical hernias, including: Indirect hernia. This type is located just above or below the umbilicus. It is the most common type of umbilical hernia in adults. Direct hernia. This type forms through an opening formed by the umbilicus. Reducible hernia. This type of hernia comes and goes. It Mathers be visible only when you strain, lift something heavy, or cough. This type of hernia can be pushed back into the abdomen (reduced). Incarcerated hernia. This type traps abdominal tissue inside the hernia. This type of hernia cannot be reduced. Strangulated hernia. This type of hernia cuts off blood flow to the tissues inside the hernia. The tissues can start to die if this happens. This type of hernia requires emergency treatment. What are the causes? An umbilical hernia happens when tissue inside the abdomen presses on a weak area of the abdominal muscles. What increases the risk? You Frisbee have a greater risk of this condition if you: Are obese. Have had several pregnancies. Have a buildup of fluid inside your abdomen. Have had surgery that weakens the abdominal muscles. What are the signs or symptoms? The main symptom of this condition is a painless bulge at or near the belly  button. A reducible hernia [place] be visible only when you strain, lift something heavy, or cough. Other symptoms Miralles include: Dull pain. A feeling of pressure. Symptoms of a strangulated hernia [place] include: Pain that gets increasingly worse. Nausea and vomiting. Pain when pressing on the hernia. Skin over the hernia becoming red or purple. Constipation. Blood in the stool. How is this diagnosed? This condition Tandy be diagnosed based on: A physical exam. You Clemon be asked to cough or strain while standing. These actions increase the pressure inside your abdomen and can force the hernia through the opening in your muscles. Your health care provider [place]gilvray try to reduce the hernia by pressing on it. Your symptoms and medical history. How is this treated? Surgery is the only treatment for an umbilical hernia. Surgery for a strangulated hernia is done as soon as possible. If you have a small hernia that is not incarcerated, you Dollard need to lose weight before having surgery. Follow these instructions at home: Lose weight, if told by your health care provider. Do not try to push the hernia back in. Watch your hernia for any changes in color or size. Tell your health care provider if any changes occur. You Randhawa need to avoid activities that increase pressure on your hernia. Do not lift anything that is heavier than 10 lb (4.5 kg), or the limit that you are told, until your health care provider says that it is safe. Take over-the-counter and prescription medicines only as told by your health care provider. Keep all follow-up visits. This is important. Contact a health care   if: Your hernia gets larger. Your hernia becomes painful. Get help right away if: You develop sudden, severe pain near the area of your hernia. You have pain as well as nausea or vomiting. You have pain and the skin over your hernia changes color. You develop a fever or chills. Summary A hernia is a bulge of tissue that  pushes through an opening between muscles. An umbilical hernia happens near the belly button. Surgery is the only treatment for an umbilical hernia. Do not try to push your hernia back in. Keep all follow-up visits. This is important. This information is not intended to replace advice given to you by your health care provider. Make sure you discuss any questions you have with your health care provider. Inguinal Hernia, Adult An inguinal hernia is when fat or your intestines push through a weak spot in a muscle where your leg meets your lower belly (groin). This causes a bulge. This kind of hernia could also be: In your scrotum, if you are male. In folds of skin around your vagina, if you are male. There are three types of inguinal hernias: Hernias that can be pushed back into the belly (are reducible). This type rarely causes pain. Hernias that cannot be pushed back into the belly (are incarcerated). Hernias that cannot be pushed back into the belly and lose their blood supply (are strangulated). This type needs emergency surgery. What are the causes? This condition is caused by having a weak spot in the muscles or tissues in your groin. This develops over time. The hernia may poke through the weak spot when you strain your lower belly muscles all of a sudden, such as when you: Lift a heavy object. Strain to poop (have a bowel movement). Trouble pooping (constipation) can lead to straining. Cough. What increases the risk? This condition is more likely to develop in: Males. Pregnant females. People who: Are overweight. Work in jobs that require long periods of standing or heavy lifting. Have had an inguinal hernia before. Smoke or have lung disease. These factors can lead to long-term (chronic) coughing. What are the signs or symptoms? Symptoms may depend on the size of the hernia. Often, a small hernia has no symptoms. Symptoms of a larger hernia may include: A bulge in the groin area.  This is easier to see when standing. You might not be able to see it when you are lying down. Pain or burning in the groin. This may get worse when you lift, strain, or cough. A dull ache or a feeling of pressure in the groin. An abnormal bulge in the scrotum, in males. Symptoms of a strangulated inguinal hernia may include: A bulge in your groin that is very painful and tender to the touch. A bulge that turns red or purple. Fever, feeling like you may vomit (nausea), and vomiting. Not being able to poop or to pass gas. How is this treated? Treatment depends on the size of your hernia and whether you have symptoms. If you do not have symptoms, your doctor may have you watch your hernia carefully and have you come in for follow-up visits. If your hernia is large or if you have symptoms, you may need surgery to repair the hernia. Follow these instructions at home: Lifestyle Avoid lifting heavy objects. Avoid standing for long amounts of time. Do not smoke or use any products that contain nicotine or tobacco. If you need help quitting, ask your doctor. Stay at a healthy weight. Prevent trouble pooping You  may need to take these actions to prevent or treat trouble pooping: Drink enough fluid to keep your pee (urine) pale yellow. Take over-the-counter or prescription medicines. Eat foods that are high in fiber. These include beans, whole grains, and fresh fruits and vegetables. Limit foods that are high in fat and sugar. These include fried or sweet foods. General instructions You may try to push your hernia back in place by very gently pressing on it when you are lying down. Do not try to push the bulge back in if it will not go in easily. Watch your hernia for any changes in shape, size, or color. Tell your doctor if you see any changes. Take over-the-counter and prescription medicines only as told by your doctor. Keep all follow-up visits. Contact a doctor if: You have a fever or  chills. You have new symptoms. Your symptoms get worse. Get help right away if: You have pain in your groin that gets worse all of a sudden. You have a bulge in your groin that: Gets bigger all of a sudden, and it does not get smaller after that. Turns red or purple. Is painful when you touch it. You are a male, and you have: Sudden pain in your scrotum. A sudden change in the size of your scrotum. You cannot push the hernia back in place by very gently pressing on it when you are lying down. You feel like you may vomit, and that feeling does not go away. You keep vomiting. You have a fast heartbeat. You cannot poop or pass gas. These symptoms may be an emergency. Get help right away. Call your local emergency services (911 in the [place] Do not wait to see if the symptoms will go away. Do not drive yourself to the hospital. Summary An inguinal hernia is when fat or your intestines push through a weak spot in a muscle where your leg meets your lower belly (groin). This causes a bulge. If you do not have symptoms, you may not need treatment. If you have symptoms or a large hernia, you may need surgery. Avoid lifting heavy objects. Also, avoid standing for long amounts of time. Do not try to push the bulge back in if it will not go in easily. This information is not intended to replace advice given to you by your health care provider. Make sure you discuss any questions you have with your health care provider. Document Revised: 06/12/2020 Document Reviewed: 06/12/2020 Elsevier Patient Education  2024 [employer].

## 2023-04-24 NOTE — Telephone Encounter (Signed)
Faxed medical clearance to Dr. Jadali at [phone]. 

## 2023-04-27 ENCOUNTER — Encounter
Admission: RE | Admit: 2023-04-27 | Discharge: 2023-04-27 | Disposition: A | Discharge status: Home / Self Care | Payer: Medicare HMO | Source: Ambulatory Visit | Attending: Surgery | Admitting: Surgery

## 2023-04-27 ENCOUNTER — Telehealth: Payer: Self-pay | Admitting: Surgery

## 2023-04-27 DIAGNOSIS — I10 Essential (primary) hypertension: Secondary | ICD-10-CM

## 2023-04-27 DIAGNOSIS — Z01810 Encounter for preprocedural cardiovascular examination: Secondary | ICD-10-CM

## 2023-04-27 DIAGNOSIS — E119 Type 2 diabetes mellitus without complications: Secondary | ICD-10-CM

## 2023-04-27 DIAGNOSIS — Z01818 Encounter for other preprocedural examination: Secondary | ICD-10-CM

## 2023-04-27 HISTORY — DX: Type 2 diabetes mellitus without complications: E11.9

## 2023-04-27 HISTORY — DX: Unspecified asthma, uncomplicated: J45.909

## 2023-04-27 NOTE — Telephone Encounter (Signed)
Patient has been advised of Pre-Admission date/time, and Surgery date at [facility].  Surgery Date: 05/05/23 Preadmission Testing Date: 04/27/23 (phone 1p-4p)  Patient has been made aware to call [phone], between 1-3:00pm the day before surgery, to find out what time to arrive for surgery.    

## 2023-04-27 NOTE — Patient Instructions (Addendum)
Your procedure is scheduled on:05-05-23 Tuesday Report to the Registration Desk on the 1st floor of the [facility].Then proceed to the 2nd floor Surgery Desk To find out your arrival time, please call [phone] between 1PM - 3PM on:05-04-23 Monday If your arrival time is 6:00 am, do not arrive before that time as the [facility] entrance doors do not open until 6:00 am.  REMEMBER: Instructions that are not followed completely may result in serious medical risk, up to and including death; or upon the discretion of your surgeon and anesthesiologist your surgery may need to be rescheduled.  Do not eat food after midnight the night before surgery.  No gum chewing or hard candies.  You may however, drink Water up to 2 hours before you are scheduled to arrive for your surgery. Do not drink anything within 2 hours of your scheduled arrival time.  One week prior to surgery: Stop Anti-inflammatories (NSAIDS) such as Advil, Aleve, Ibuprofen, Motrin, Naproxen, Naprosyn and Aspirin based products such as Excedrin, Goody's Powder, BC Powder.You may however, take Tylenol if needed for pain up until the day of surgery. Stop ANY OVER THE COUNTER supplements/vitamins NOW (04-27-23) until after surgery.   Continue taking all prescribed medications with the exception of the following: -Stop your 81 mg Aspirin 5 days prior to surgery-Last dose will be on 04-29-23 Wednesday -Stop your JARDIANCE 3 days prior to surgery-Last dose will be on 05-01-23 Friday -Stop your metFORMIN (GLUCOPHAGE) 2 days prior to surgery-Last dose will be on 05-02-23 Saturday  TAKE ONLY THESE MEDICATIONS THE MORNING OF SURGERY WITH A SIP OF WATER: -Fenofibrate   Use your ipratropium-albuterol (DUONEB) Nebulizer the morning of surgery  No Alcohol for 24 hours before or after surgery.  No Smoking including e-cigarettes for 24 hours before surgery.  No chewable tobacco products for at least 6 hours before surgery.  No nicotine patches on  the day of surgery.  Do not use any "recreational" drugs for at least a week (preferably 2 weeks) before your surgery.  Please be advised that the combination of cocaine and anesthesia may have negative outcomes, up to and including death. If you test positive for cocaine, your surgery will be cancelled.  On the morning of surgery brush your teeth with toothpaste and water, you may rinse your mouth with mouthwash if you wish. Do not swallow any toothpaste or mouthwash.  Use CHG Soap as directed on instruction sheet.  Do not wear jewelry, make-up, hairpins, clips or nail polish.  Do not wear lotions, powders, or perfumes.   Do not shave body hair from the neck down 48 hours before surgery.  Contact lenses, hearing aids and dentures may not be worn into surgery.  Do not bring valuables to the hospital. [facility] is not responsible for any missing/lost belongings or valuables.   Notify your doctor if there is any change in your medical condition (cold, fever, infection).  Wear comfortable clothing (specific to your surgery type) to the hospital.  After surgery, you can help prevent lung complications by doing breathing exercises.  Take deep breaths and cough every 1-2 hours. Your doctor may order a device called an Incentive Spirometer to help you take deep breaths. When coughing or sneezing, hold a pillow firmly against your incision with both hands. This is called "splinting." Doing this helps protect your incision. It also decreases belly discomfort.  If you are being admitted to the hospital overnight, leave your suitcase in the car. After surgery it may  be brought to your room.  In case of increased patient census, it may be necessary for you, the patient, to continue your postoperative care in the Same Day Surgery department.  If you are being discharged the day of surgery, you will not be allowed to drive home. You will need a responsible individual to drive you home and stay  with you for 24 hours after surgery.   If you are taking public transportation, you will need to have a responsible individual with you.  Please call the Pre-admissions Testing Dept. at [phone] if you have any questions about these instructions.  Surgery Visitation Policy:  Patients having surgery or a procedure may have two visitors.  Children under the age of 20 must have an adult with them who is not the patient.     Preparing for Surgery with CHLORHEXIDINE GLUCONATE (CHG) Soap  Chlorhexidine Gluconate (CHG) Soap  o An antiseptic cleaner that kills germs and bonds with the skin to continue killing germs even after washing  o Used for showering the night before surgery and morning of surgery  Before surgery, you can play an important role by reducing the number of germs on your skin.  CHG (Chlorhexidine gluconate) soap is an antiseptic cleanser which kills germs and bonds with the skin to continue killing germs even after washing.  Please do not use if you have an allergy to CHG or antibacterial soaps. If your skin becomes reddened/irritated stop using the CHG.  1. Shower the NIGHT BEFORE SURGERY and the MORNING OF SURGERY with CHG soap.  2. If you choose to wash your hair, wash your hair first as usual with your normal shampoo.  3. After shampooing, rinse your hair and body thoroughly to remove the shampoo.  4. Use CHG as you would any other liquid soap. You can apply CHG directly to the skin and wash gently with a scrungie or a clean washcloth.  5. Apply the CHG soap to your body only from the neck down. Do not use on open wounds or open sores. Avoid contact with your eyes, ears, mouth, and genitals (private parts). Wash face and genitals (private parts) with your normal soap.  6. Wash thoroughly, paying special attention to the area where your surgery will be performed.  7. Thoroughly rinse your body with warm water.  8. Do not shower/wash with your normal soap  after using and rinsing off the CHG soap.  9. Pat yourself dry with a clean towel.  10. Wear clean pajamas to bed the night before surgery.  12. Place clean sheets on your bed the night of your first shower and do not sleep with pets.  13. Shower again with the CHG soap on the day of surgery prior to arriving at the hospital.  14. Do not apply any deodorants/lotions/powders.  15. Please wear clean clothes to the hospital.

## 2023-04-28 ENCOUNTER — Telehealth: Payer: Self-pay

## 2023-04-28 ENCOUNTER — Encounter
Admission: RE | Admit: 2023-04-28 | Discharge: 2023-04-28 | Disposition: A | Discharge status: Home / Self Care | Payer: Medicare HMO | Source: Ambulatory Visit | Attending: Surgery | Admitting: Surgery

## 2023-04-28 DIAGNOSIS — E119 Type 2 diabetes mellitus without complications: Secondary | ICD-10-CM | POA: Insufficient documentation

## 2023-04-28 DIAGNOSIS — Z01810 Encounter for preprocedural cardiovascular examination: Secondary | ICD-10-CM | POA: Diagnosis present

## 2023-04-28 DIAGNOSIS — I10 Essential (primary) hypertension: Secondary | ICD-10-CM | POA: Insufficient documentation

## 2023-04-28 NOTE — Telephone Encounter (Signed)
Received medical clearance from Dr. Dario Guardian. Pt's risk assessment is low and is optimized for surgery.

## 2023-05-05 ENCOUNTER — Other Ambulatory Visit: Payer: Self-pay

## 2023-05-05 ENCOUNTER — Encounter: Payer: Self-pay | Admitting: Surgery

## 2023-05-05 ENCOUNTER — Encounter
Admission: RE | Disposition: A | Discharge status: Home / Self Care | Payer: Self-pay | Source: Ambulatory Visit | Attending: Surgery

## 2023-05-05 ENCOUNTER — Ambulatory Visit
Admission: RE | Admit: 2023-05-05 | Discharge: 2023-05-05 | Disposition: A | Discharge status: Home / Self Care | Payer: Medicare HMO | Source: Ambulatory Visit | Attending: Surgery | Admitting: Surgery

## 2023-05-05 ENCOUNTER — Ambulatory Visit: Payer: Medicare HMO | Admitting: Anesthesiology

## 2023-05-05 DIAGNOSIS — E1122 Type 2 diabetes mellitus with diabetic chronic kidney disease: Secondary | ICD-10-CM | POA: Diagnosis not present

## 2023-05-05 DIAGNOSIS — Z87891 Personal history of nicotine dependence: Secondary | ICD-10-CM | POA: Insufficient documentation

## 2023-05-05 DIAGNOSIS — I700 Atherosclerosis of aorta: Secondary | ICD-10-CM | POA: Diagnosis not present

## 2023-05-05 DIAGNOSIS — N182 Chronic kidney disease, stage 2 (mild): Secondary | ICD-10-CM | POA: Insufficient documentation

## 2023-05-05 DIAGNOSIS — Z01818 Encounter for other preprocedural examination: Secondary | ICD-10-CM

## 2023-05-05 DIAGNOSIS — Z7984 Long term (current) use of oral hypoglycemic drugs: Secondary | ICD-10-CM | POA: Diagnosis not present

## 2023-05-05 DIAGNOSIS — Z79899 Other long term (current) drug therapy: Secondary | ICD-10-CM | POA: Insufficient documentation

## 2023-05-05 DIAGNOSIS — K420 Umbilical hernia with obstruction, without gangrene: Secondary | ICD-10-CM

## 2023-05-05 DIAGNOSIS — I129 Hypertensive chronic kidney disease with stage 1 through stage 4 chronic kidney disease, or unspecified chronic kidney disease: Secondary | ICD-10-CM | POA: Diagnosis not present

## 2023-05-05 LAB — GLUCOSE, CAPILLARY
Glucose-Capillary: 119 mg/dL — ABNORMAL HIGH (ref 70–99)
Glucose-Capillary: 170 mg/dL — ABNORMAL HIGH (ref 70–99)

## 2023-05-05 SURGERY — REPAIR, HERNIA, UMBILICAL, ROBOT-ASSISTED
Anesthesia: General

## 2023-05-05 MED ORDER — HYDROMORPHONE HCL 1 MG/ML IJ SOLN
INTRAMUSCULAR | Status: DC | PRN
  Administered 2023-05-05: .2 mg via INTRAVENOUS
  Administered 2023-05-05: .3 mg via INTRAVENOUS

## 2023-05-05 MED ORDER — CHLORHEXIDINE GLUCONATE 0.12 % MT SOLN
OROMUCOSAL | Status: AC
  Filled 2023-05-05: qty 15

## 2023-05-05 MED ORDER — BUPIVACAINE LIPOSOME 1.3 % IJ SUSP: 20.0000 mL | Freq: Once | INTRAMUSCULAR | Status: DC

## 2023-05-05 MED ORDER — CHLORHEXIDINE GLUCONATE 0.12 % MT SOLN
15.0000 mL | Freq: Once | OROMUCOSAL | Status: AC
  Administered 2023-05-05: 15 mL via OROMUCOSAL

## 2023-05-05 MED ORDER — FENTANYL CITRATE (PF) 100 MCG/2ML IJ SOLN
INTRAMUSCULAR | Status: AC
  Filled 2023-05-05: qty 2

## 2023-05-05 MED ORDER — ONDANSETRON HCL 4 MG/2ML IJ SOLN
INTRAMUSCULAR | Status: DC | PRN
  Administered 2023-05-05: 4 mg via INTRAVENOUS

## 2023-05-05 MED ORDER — EPINEPHRINE PF 1 MG/ML IJ SOLN
INTRAMUSCULAR | Status: AC
  Filled 2023-05-05: qty 1

## 2023-05-05 MED ORDER — CEFAZOLIN SODIUM-DEXTROSE 2-3 GM-%(50ML) IV SOLR
INTRAVENOUS | Status: DC | PRN
  Administered 2023-05-05: 2 g via INTRAVENOUS

## 2023-05-05 MED ORDER — ACETAMINOPHEN 500 MG PO TABS
ORAL_TABLET | ORAL | Status: AC
  Filled 2023-05-05: qty 2

## 2023-05-05 MED ORDER — ACETAMINOPHEN 10 MG/ML IV SOLN: 1000.0000 mg | Freq: Once | INTRAVENOUS | Status: DC | PRN

## 2023-05-05 MED ORDER — OXYCODONE HCL 5 MG/5ML PO SOLN: 5.0000 mg | Freq: Once | ORAL | Status: AC | PRN

## 2023-05-05 MED ORDER — SODIUM CHLORIDE 0.9 % IV SOLN: INTRAVENOUS | Status: DC

## 2023-05-05 MED ORDER — GABAPENTIN 300 MG PO CAPS
ORAL_CAPSULE | ORAL | Status: AC
  Filled 2023-05-05: qty 1

## 2023-05-05 MED ORDER — CHLORHEXIDINE GLUCONATE CLOTH 2 % EX PADS: 6.0000 | MEDICATED_PAD | Freq: Once | CUTANEOUS | Status: DC

## 2023-05-05 MED ORDER — LACTATED RINGERS IV SOLN: INTRAVENOUS | Status: DC

## 2023-05-05 MED ORDER — FAMOTIDINE 20 MG PO TABS
ORAL_TABLET | ORAL | Status: AC
  Filled 2023-05-05: qty 1

## 2023-05-05 MED ORDER — OXYCODONE HCL 5 MG PO TABS
5.0000 mg | ORAL_TABLET | Freq: Once | ORAL | Status: AC | PRN
  Administered 2023-05-05: 5 mg via ORAL

## 2023-05-05 MED ORDER — 0.9 % SODIUM CHLORIDE (POUR BTL) OPTIME
TOPICAL | Status: DC | PRN
  Administered 2023-05-05: 500 mL

## 2023-05-05 MED ORDER — BUPIVACAINE LIPOSOME 1.3 % IJ SUSP
INTRAMUSCULAR | Status: DC | PRN
  Administered 2023-05-05: 20 mL

## 2023-05-05 MED ORDER — GABAPENTIN 300 MG PO CAPS
300.0000 mg | ORAL_CAPSULE | ORAL | Status: AC
  Administered 2023-05-05: 300 mg via ORAL

## 2023-05-05 MED ORDER — ORAL CARE MOUTH RINSE: 15.0000 mL | Freq: Once | OROMUCOSAL | Status: AC

## 2023-05-05 MED ORDER — ACETAMINOPHEN 500 MG PO TABS: 1000.0000 mg | ORAL_TABLET | Freq: Four times a day (QID) | ORAL | Status: DC | PRN

## 2023-05-05 MED ORDER — LIDOCAINE HCL (CARDIAC) PF 100 MG/5ML IV SOSY
PREFILLED_SYRINGE | INTRAVENOUS | Status: DC | PRN
  Administered 2023-05-05: 60 mg via INTRAVENOUS

## 2023-05-05 MED ORDER — ACETAMINOPHEN 500 MG PO TABS
1000.0000 mg | ORAL_TABLET | ORAL | Status: AC
  Administered 2023-05-05: 1000 mg via ORAL

## 2023-05-05 MED ORDER — FAMOTIDINE 20 MG PO TABS
20.0000 mg | ORAL_TABLET | Freq: Once | ORAL | Status: AC
  Administered 2023-05-05: 20 mg via ORAL

## 2023-05-05 MED ORDER — HYDROMORPHONE HCL 1 MG/ML IJ SOLN
INTRAMUSCULAR | Status: AC
  Filled 2023-05-05: qty 1

## 2023-05-05 MED ORDER — CEFAZOLIN SODIUM-DEXTROSE 2-4 GM/100ML-% IV SOLN: 2.0000 g | INTRAVENOUS | Status: DC

## 2023-05-05 MED ORDER — FENTANYL CITRATE (PF) 100 MCG/2ML IJ SOLN
25.0000 ug | INTRAMUSCULAR | Status: DC | PRN
  Administered 2023-05-05 (×4): 25 ug via INTRAVENOUS

## 2023-05-05 MED ORDER — IBUPROFEN 600 MG PO TABS
600.0000 mg | ORAL_TABLET | Freq: Three times a day (TID) | ORAL | 1 refills | Status: DC | PRN
Start: 2023-05-05 — End: 2024-07-27

## 2023-05-05 MED ORDER — PROPOFOL 10 MG/ML IV BOLUS
INTRAVENOUS | Status: DC | PRN
  Administered 2023-05-05: 150 mg via INTRAVENOUS

## 2023-05-05 MED ORDER — ROCURONIUM BROMIDE 100 MG/10ML IV SOLN
INTRAVENOUS | Status: DC | PRN
  Administered 2023-05-05: 50 mg via INTRAVENOUS
  Administered 2023-05-05: 20 mg via INTRAVENOUS

## 2023-05-05 MED ORDER — LACTATED RINGERS IV SOLN: INTRAVENOUS | Status: DC | PRN

## 2023-05-05 MED ORDER — KETOROLAC TROMETHAMINE 15 MG/ML IJ SOLN
INTRAMUSCULAR | Status: DC | PRN
  Administered 2023-05-05: 15 mg via INTRAVENOUS

## 2023-05-05 MED ORDER — CEFAZOLIN SODIUM-DEXTROSE 2-4 GM/100ML-% IV SOLN
INTRAVENOUS | Status: AC
  Filled 2023-05-05: qty 100

## 2023-05-05 MED ORDER — DEXAMETHASONE SODIUM PHOSPHATE 10 MG/ML IJ SOLN
INTRAMUSCULAR | Status: DC | PRN
  Administered 2023-05-05: 5 mg via INTRAVENOUS

## 2023-05-05 MED ORDER — BUPIVACAINE-EPINEPHRINE 0.5% -1:200000 IJ SOLN
INTRAMUSCULAR | Status: DC | PRN
  Administered 2023-05-05: 30 mL

## 2023-05-05 MED ORDER — OXYCODONE HCL 5 MG PO TABS
5.0000 mg | ORAL_TABLET | ORAL | 0 refills | Status: DC | PRN
Start: 2023-05-05 — End: 2023-05-19

## 2023-05-05 MED ORDER — SODIUM CHLORIDE FLUSH 0.9 % IV SOLN
INTRAVENOUS | Status: AC
  Filled 2023-05-05: qty 10

## 2023-05-05 MED ORDER — FENTANYL CITRATE (PF) 100 MCG/2ML IJ SOLN
INTRAMUSCULAR | Status: DC | PRN
  Administered 2023-05-05 (×2): 50 ug via INTRAVENOUS

## 2023-05-05 MED ORDER — ONDANSETRON HCL 4 MG/2ML IJ SOLN: 4.0000 mg | Freq: Once | INTRAMUSCULAR | Status: DC | PRN

## 2023-05-05 MED ORDER — OXYCODONE HCL 5 MG PO TABS
ORAL_TABLET | ORAL | Status: AC
  Filled 2023-05-05: qty 1

## 2023-05-05 MED ORDER — BUPIVACAINE LIPOSOME 1.3 % IJ SUSP
INTRAMUSCULAR | Status: AC
  Filled 2023-05-05: qty 20

## 2023-05-05 MED ORDER — BUPIVACAINE HCL (PF) 0.5 % IJ SOLN
INTRAMUSCULAR | Status: AC
  Filled 2023-05-05: qty 30

## 2023-05-05 MED ORDER — SUGAMMADEX SODIUM 200 MG/2ML IV SOLN
INTRAVENOUS | Status: DC | PRN
  Administered 2023-05-05: 200 mg via INTRAVENOUS

## 2023-05-05 SURGICAL SUPPLY — 57 items
ADH SKN CLS APL DERMABOND .7 (GAUZE/BANDAGES/DRESSINGS) ×1
BLADE SURG SZ11 CARB STEEL (BLADE) ×1 IMPLANT
CANNULA CAP OBTURATR AIRSEAL 8 (CAP) IMPLANT
COVER TIP SHEARS 8 DVNC (MISCELLANEOUS) ×1 IMPLANT
COVER WAND RF STERILE (DRAPES) ×1 IMPLANT
DERMABOND ADVANCED .7 DNX12 (GAUZE/BANDAGES/DRESSINGS) ×1 IMPLANT
DRAPE ARM DVNC X/XI (DISPOSABLE) ×3 IMPLANT
DRAPE COLUMN DVNC XI (DISPOSABLE) ×1 IMPLANT
ELECT CAUTERY BLADE TIP 2.5 (TIP) ×1
ELECT REM PT RETURN 9FT ADLT (ELECTROSURGICAL) ×1
ELECTRODE CAUTERY BLDE TIP 2.5 (TIP) ×1 IMPLANT
ELECTRODE REM PT RTRN 9FT ADLT (ELECTROSURGICAL) ×1 IMPLANT
FORCEPS BPLR R/ABLATION 8 DVNC (INSTRUMENTS) ×1 IMPLANT
GLOVE SURG SYN 7.0 (GLOVE) ×2 IMPLANT
GLOVE SURG SYN 7.0 PF PI (GLOVE) ×2 IMPLANT
GLOVE SURG SYN 7.5 E (GLOVE) ×2 IMPLANT
GLOVE SURG SYN 7.5 PF PI (GLOVE) ×2 IMPLANT
GOWN STRL REUS W/ TWL LRG LVL3 (GOWN DISPOSABLE) ×3 IMPLANT
GOWN STRL REUS W/TWL LRG LVL3 (GOWN DISPOSABLE) ×3
GRASPER SUT TROCAR 14GX15 (MISCELLANEOUS) ×1 IMPLANT
IRRIGATION STRYKERFLOW (MISCELLANEOUS) IMPLANT
IRRIGATOR STRYKERFLOW (MISCELLANEOUS)
IV NS 1000ML (IV SOLUTION)
IV NS 1000ML BAXH (IV SOLUTION) IMPLANT
KIT PINK PAD W/HEAD ARE REST (MISCELLANEOUS) ×1
KIT PINK PAD W/HEAD ARM REST (MISCELLANEOUS) ×1 IMPLANT
LABEL OR SOLS (LABEL) ×1 IMPLANT
MANIFOLD NEPTUNE II (INSTRUMENTS) ×1 IMPLANT
MESH VENTRALIGHT ST 4.5 ECHO (Mesh General) IMPLANT
NDL DRIVE SUT CUT DVNC (INSTRUMENTS) ×1 IMPLANT
NDL HYPO 22X1.5 SAFETY MO (MISCELLANEOUS) ×1 IMPLANT
NDL INSUFFLATION 14GA 120MM (NEEDLE) ×1 IMPLANT
NEEDLE DRIVE SUT CUT DVNC (INSTRUMENTS) ×1 IMPLANT
NEEDLE HYPO 22X1.5 SAFETY MO (MISCELLANEOUS) ×1 IMPLANT
NEEDLE INSUFFLATION 14GA 120MM (NEEDLE) ×1 IMPLANT
NS IRRIG 500ML POUR BTL (IV SOLUTION) IMPLANT
OBTURATOR OPTICAL STND 8 DVNC (TROCAR) ×1
OBTURATOR OPTICALSTD 8 DVNC (TROCAR) ×1 IMPLANT
PACK LAP CHOLECYSTECTOMY (MISCELLANEOUS) ×1 IMPLANT
PENCIL SMOKE EVACUATOR (MISCELLANEOUS) ×1 IMPLANT
SCISSORS MNPLR CVD DVNC XI (INSTRUMENTS) ×1 IMPLANT
SEAL UNIV 5-12 XI (MISCELLANEOUS) ×2 IMPLANT
SET TUBE FILTERED XL AIRSEAL (SET/KITS/TRAYS/PACK) IMPLANT
SET TUBE SMOKE EVAC HIGH FLOW (TUBING) ×1 IMPLANT
SOL ELECTROSURG ANTI STICK (MISCELLANEOUS) ×1
SOLUTION ELECTROSURG ANTI STCK (MISCELLANEOUS) ×1 IMPLANT
SPONGE T-LAP 18X18 ~~LOC~~+RFID (SPONGE) ×1 IMPLANT
SUT MNCRL 4-0 (SUTURE) ×1
SUT MNCRL 4-0 27XMFL (SUTURE) ×1
SUT STRATAFIX PDS 30 CT-1 (SUTURE) ×1 IMPLANT
SUT VICRYL 0 UR6 27IN ABS (SUTURE) ×2 IMPLANT
SUT VLOC 90 2/L VL 12 GS22 (SUTURE) ×2 IMPLANT
SUTURE MNCRL 4-0 27XMF (SUTURE) ×1 IMPLANT
TAPE TRANSPORE STRL 2 31045 (GAUZE/BANDAGES/DRESSINGS) ×1 IMPLANT
TRAP FLUID SMOKE EVACUATOR (MISCELLANEOUS) ×1 IMPLANT
TRAY FOLEY SLVR 16FR LF STAT (SET/KITS/TRAYS/PACK) ×1 IMPLANT
WATER STERILE IRR 500ML POUR (IV SOLUTION) ×1 IMPLANT

## 2023-05-05 NOTE — Op Note (Signed)
  Procedure Date:  05/05/2023  Pre-operative Diagnosis:  Incarcerated umbilical hernia  Post-operative Diagnosis: Incarcerated umbilical hernia 1.5 cm  Procedure:  Robotic assisted umbilical Hernia Repair with mesh  Surgeon:  Howie Ill, MD  Anesthesia:  General endotracheal  Estimated Blood Loss:  10 ml  Specimens:  None  Complications:  None  Indications for Procedure:  This is a 72 y.o. male who presents with an incarcerated umbilical hernia.  The options of surgery versus observation were reviewed with the patient and/or family. The risks of bleeding, abscess or infection, recurrence of symptoms, potential for an open procedure, injury to surrounding structures, and chronic pain were all discussed with the patient and was willing to proceed.  Description of Procedure: The patient was correctly identified in the preoperative area and brought into the operating room.  The patient was placed supine with VTE prophylaxis in place.  Appropriate time-outs were performed.  Anesthesia was induced and the patient was intubated.  Appropriate antibiotics were infused.  The abdomen was prepped and draped in a sterile fashion. The patient's hernia defect was marked with a marking pen.  A Veress needle was introduced in the left upper quadrant and pneumoperitoneum was obtained with appropriate pressures.  Using Optiview technique, an 8 mm port was introduced in the left lateral abdominal wall without complications.  Then, a 12 mm port was introduced in the left upper quadrant and an 8 mm port in the left lower quadrant under direct visualization.  A 4.5 inch Bard Ventralight ST Echo mesh, a 0 Stratafix suture, and two 2-0 V-loc sutures were inserted through the 12 mm port under direct visualization.  The DaVinci platform was docked, camera targeted, and instruments placed under direct visualization.  The patient's hernia was fully reduced using cautery.  It contained incarcerated omentum, which  was otherwise healthy.  The peritoneum and preperitoneal fat were dissected and resected to allow better exposure of the hernia defect and for better mesh placement.  The hernia defect was closed using the stratafix suture.  A PMI was brought through the center of the hernia defect and the positioning system of the mesh was passed through and insufflated.  This allowed the mesh to splay open and be centered over the repair site with good overlap.  The mesh was then sutured in place circumferentially and through the center of the mesh using the V-loc sutures.  All needles and the positioning system were then removed through the 12 mm port without complications.  The preperitoneal fat was removed through the 12 mm port as well.  The DaVinci platform was then undocked and instruments removed.    60 ml of Exparel solution mixed with 0.5% bupivacaine with epi was infiltrated around the mesh edges, hernia repair site, and port sites.  The 12 mm port was removed and the fascia was closed under direct visualization utilizing an Endo Close technique with 0 Vicryl suture.  The 8 mm ports were removed. The 12 mm incision was closed using 3-0 Vicryl and 4-0 Monocryl, and the other port incisions were closed with 4-0 Monocryl.  The wounds were cleaned and sealed with DermaBond.  The patient was emerged from anesthesia and extubated and brought to the recovery room for further management.  The patient tolerated the procedure well and all counts were correct at the end of the case.   Howie Ill, MD

## 2023-05-05 NOTE — Interval H&P Note (Signed)
History and Physical Interval Note:  05/05/2023 1:03 PM  Jason Hammond  has presented today for surgery, with the diagnosis of incarcerated umbilical hernia less 3 cm.  The various methods of treatment have been discussed with the patient and family. After consideration of risks, benefits and other options for treatment, the patient has consented to  Procedure(s): XI ROBOT ASSISTED UMBILICAL HERNIA REPAIR (N/A) as a surgical intervention.  The patient's history has been reviewed, patient examined, no change in status, stable for surgery.  I have reviewed the patient's chart and labs.  Questions were answered to the patient's satisfaction.     Avy Barlett

## 2023-05-05 NOTE — Anesthesia Preprocedure Evaluation (Addendum)
Anesthesia Evaluation  Patient identified by MRN, date of birth, ID band Patient awake    Reviewed: Allergy & Precautions, NPO status , Patient's Chart, lab work & pertinent test results  History of Anesthesia Complications Negative for: history of anesthetic complications  Airway Mallampati: III   Neck ROM: Full    Dental  (+) Missing, Chipped   Pulmonary asthma , former smoker (quit as teenager)   Pulmonary exam normal breath sounds clear to auscultation       Cardiovascular hypertension, Normal cardiovascular exam Rhythm:Regular Rate:Normal  ECG 04/28/23: normal   Neuro/Psych negative neurological ROS     GI/Hepatic negative GI ROS,,,  Endo/Other  diabetes, Type 2    Renal/GU Renal disease (stage II CKD)     Musculoskeletal   Abdominal   Peds  Hematology negative hematology ROS (+)   Anesthesia Other Findings   Reproductive/Obstetrics                             Anesthesia Physical Anesthesia Plan  ASA: 2  Anesthesia Plan: General   Post-op Pain Management:    Induction: Intravenous  PONV Risk Score and Plan: 2 and Ondansetron, Dexamethasone and Treatment may vary due to age or medical condition  Airway Management Planned: Oral ETT  Additional Equipment:   Intra-op Plan:   Post-operative Plan: Extubation in OR  Informed Consent: I have reviewed the patients History and Physical, chart, labs and discussed the procedure including the risks, benefits and alternatives for the proposed anesthesia with the patient or authorized representative who has indicated his/her understanding and acceptance.     Dental advisory given  Plan Discussed with: CRNA  Anesthesia Plan Comments: (Patient consented for risks of anesthesia including but not limited to:  - adverse reactions to medications - damage to eyes, teeth, lips or other oral mucosa - nerve damage due to positioning  -  sore throat or hoarseness - damage to heart, brain, nerves, lungs, other parts of body or loss of life  Informed patient about role of CRNA in peri- and intra-operative care.  Patient voiced understanding.)        Anesthesia Quick Evaluation

## 2023-05-05 NOTE — Transfer of Care (Signed)
Immediate Anesthesia Transfer of Care Note  Patient: Jason Hammond  Procedure(s) Performed: XI ROBOT ASSISTED UMBILICAL HERNIA REPAIR  Patient Location: PACU  Anesthesia Type:General  Level of Consciousness: drowsy  Airway & Oxygen Therapy: Patient Spontanous Breathing and Patient connected to face mask oxygen  Post-op Assessment: Report given to RN and Post -op Vital signs reviewed and stable  Post vital signs: Reviewed  Last Vitals:  Vitals Value Taken Time  BP 167/80 05/05/23 1530  Temp 36.3 C 05/05/23 1527  Pulse 87 05/05/23 1532  Resp 18 05/05/23 1532  SpO2 99 % 05/05/23 1532  Vitals shown include unvalidated device data.  Last Pain:  Vitals:   05/05/23 1140  PainSc: 0-No pain         Complications: No notable events documented.

## 2023-05-05 NOTE — Anesthesia Procedure Notes (Signed)
Procedure Name: Intubation Date/Time: 05/05/2023 1:32 PM  Performed by: Merlene Pulling, CRNAPre-anesthesia Checklist: Patient identified, Patient being monitored, Timeout performed, Emergency Drugs available and Suction available Patient Re-evaluated:Patient Re-evaluated prior to induction Oxygen Delivery Method: Circle system utilized Preoxygenation: Pre-oxygenation with 100% oxygen Induction Type: IV induction Ventilation: Mask ventilation without difficulty Laryngoscope Size: McGraph and 4 Grade View: Grade II Tube type: Oral Tube size: 7.0 mm Number of attempts: 1 Airway Equipment and Method: Stylet Placement Confirmation: ETT inserted through vocal cords under direct vision, positive ETCO2 and breath sounds checked- equal and bilateral Secured at: 20 cm Tube secured with: Tape Dental Injury: Teeth and Oropharynx as per pre-operative assessment

## 2023-05-05 NOTE — Discharge Instructions (Addendum)
Discharge Instructions: 1.  Patient may shower, but do not scrub wounds heavily and dab dry only. 2.  Do not submerge wounds in pool/tub until fully healed. 3.  Do not apply ointments or hydrogen peroxide to the wounds. 4.  May apply ice packs to the wounds for comfort. 5.  May resume your Aspirin on 05/07/23. 6.  Do not drive while taking narcotics for pain control.  Prior to driving, make sure you are able to rotate right and left to look at blindspots without significant pain or discomfort. 7.  No heavy lifting or pushing of more than 10-15 lbs for 4 weeks.  AMBULATORY SURGERY  DISCHARGE INSTRUCTIONS   The drugs that you were given will stay in your system until tomorrow so for the next 24 hours you should not:  Drive an automobile Make any legal decisions Drink any alcoholic beverage   You may resume regular meals tomorrow.  Today it is better to start with liquids and gradually work up to solid foods.  You may eat anything you prefer, but it is better to start with liquids, then soup and crackers, and gradually work up to solid foods.   Please notify your doctor immediately if you have any unusual bleeding, trouble breathing, redness and pain at the surgery site, drainage, fever, or pain not relieved by medication.    Additional Instructions: PLEASE LEAVE EXPAREL (TEAL) ARMBAND ON FOR 4 DAYS    Please contact your physician with any problems or Same Day Surgery at [phone], Monday through Friday 6 am to 4 pm, or [place] at [facility] number at [phone].

## 2023-05-06 NOTE — Anesthesia Postprocedure Evaluation (Signed)
Anesthesia Post Note  Patient: Jason Hammond  Procedure(s) Performed: XI ROBOT ASSISTED UMBILICAL HERNIA REPAIR  Patient location during evaluation: PACU Anesthesia Type: General Level of consciousness: awake and alert Pain management: pain level controlled Vital Signs Assessment: post-procedure vital signs reviewed and stable Respiratory status: spontaneous breathing, nonlabored ventilation and respiratory function stable Cardiovascular status: blood pressure returned to baseline and stable Postop Assessment: no apparent nausea or vomiting Anesthetic complications: no   No notable events documented.   Last Vitals:  Vitals:   05/05/23 1615 05/05/23 1628  BP: (!) 150/83 (!) 170/83  Pulse: 88 91  Resp: 14 16  Temp: 36.7 C 36.7 C  SpO2: 96% 97%    Last Pain:  Vitals:   05/05/23 1628  TempSrc: Temporal  PainSc: 0-No pain                 Foye Deer

## 2023-05-19 ENCOUNTER — Ambulatory Visit: Payer: Medicare HMO | Admitting: Physician Assistant

## 2023-05-19 ENCOUNTER — Encounter: Payer: Self-pay | Admitting: Physician Assistant

## 2023-05-19 VITALS — BP 132/79 | HR 89 | Temp 98.4°F | Wt 174.2 lb

## 2023-05-19 DIAGNOSIS — K420 Umbilical hernia with obstruction, without gangrene: Secondary | ICD-10-CM

## 2023-05-19 DIAGNOSIS — Z09 Encounter for follow-up examination after completed treatment for conditions other than malignant neoplasm: Secondary | ICD-10-CM

## 2023-05-19 NOTE — Patient Instructions (Signed)

## 2023-05-19 NOTE — Progress Notes (Signed)
[facility] POST-OP OFFICE VISIT  05/19/2023  HPI: Jaclyn Carew is a 72 y.o. male 14 days s/p robotic assisted laparoscopic umbilical with Dr Aleen Campi  He is doing very well He reports mild incisional soreness He has not needed pain medications No fever, chills, nausea, emesis He is tolerating PO; no issues; normal bowel function Incisions are well healed Ambulating well; anxious to get back to gardening   Vital signs: BP 132/79   Pulse 89   Temp 98.4 F (36.9 C) (Oral)   Wt 174 lb 3.2 oz (79 kg)   SpO2 97%   BMI 26.49 kg/m    Physical Exam: Constitutional: Well appearing male, NAD Abdomen: Soft, non-tender, non-distended, no rebound/guarding Skin: Laparoscopic incisions are healing well, no erythema or drainage   Assessment/Plan: This is a 72 y.o. male 14 days s/p robotic assisted laparoscopic umbilical with Dr Aleen Campi   - Pain control prn; doing well   - Reviewed wound care recommendation; no issues  - Reviewed lifting restrictions; 6 weeks total  - He can follow up on as needed basis; He understands to call with questions/concerns  -- Lynden Oxford, PA-C [facility] 05/19/2023, 2:27 PM M-F: 7am - 4pm

## 2023-05-20 ENCOUNTER — Encounter: Payer: Medicare HMO | Admitting: Surgery

## 2023-10-19 ENCOUNTER — Other Ambulatory Visit: Payer: Self-pay

## 2023-10-19 ENCOUNTER — Ambulatory Visit: Payer: Medicare HMO | Admitting: Gastroenterology

## 2023-10-19 ENCOUNTER — Telehealth: Payer: Self-pay

## 2023-10-19 DIAGNOSIS — Z1211 Encounter for screening for malignant neoplasm of colon: Secondary | ICD-10-CM

## 2023-10-19 MED ORDER — NA SULFATE-K SULFATE-MG SULF 17.5-3.13-1.6 GM/177ML PO SOLN
1.0000 | Freq: Once | ORAL | 0 refills | Status: AC
Start: 2023-10-19 — End: 2023-10-19

## 2023-10-19 NOTE — Telephone Encounter (Signed)
Gastroenterology Pre-Procedure Review  Request Date: 11/18/23 Requesting Physician: Dr. Tobi Bastos  PATIENT REVIEW QUESTIONS: The patient responded to the following health history questions as indicated:    1. Are you having any GI issues? no 2. Do you have a personal history of Polyps? no 3. Do you have a family history of Colon Cancer or Polyps? no 4. Diabetes Mellitus? Yes has been advised to stop Metformin 2 days prior to colonoscopy.  Noted on instructions 5. Joint replacements in the past 12 months?no 6. Major health problems in the past 3 months?no 7. Any artificial heart valves, MVP, or defibrillator?no    MEDICATIONS & ALLERGIES:    Patient reports the following regarding taking any anticoagulation/antiplatelet therapy:   Plavix, Coumadin, Eliquis, Xarelto, Lovenox, Pradaxa, Brilinta, or Effient? no Aspirin? yes (81 mg daily)  Patient confirms/reports the following medications:  Current Outpatient Medications  Medication Sig Dispense Refill   acetaminophen (TYLENOL) 500 MG tablet Take 2 tablets (1,000 mg total) by mouth every 6 (six) hours as needed for mild pain.     aspirin EC 81 MG tablet 81 mg daily.     Fenofibrate 40 MG TABS Take 40 mg by mouth every morning.     ibuprofen (ADVIL) 600 MG tablet Take 1 tablet (600 mg total) by mouth every 8 (eight) hours as needed for moderate pain. 60 tablet 1   ipratropium-albuterol (DUONEB) 0.5-2.5 (3) MG/3ML SOLN Take 3 mLs by nebulization 2 (two) times daily.     JARDIANCE 10 MG TABS tablet Take 10 mg by mouth every morning. (Patient not taking: Reported on 10/19/2023)     losartan (COZAAR) 25 MG tablet Take 25 mg by mouth 2 (two) times daily.     metFORMIN (GLUCOPHAGE) 1000 MG tablet Take 1,000 mg by mouth 2 (two) times daily.     No current facility-administered medications for this visit.    Patient confirms/reports the following allergies:  No Known Allergies  No orders of the defined types were placed in this  encounter.   AUTHORIZATION INFORMATION Primary Insurance: 1D#: Group #:  Secondary Insurance: 1D#: Group #:  SCHEDULE INFORMATION: Date: 11/18/23 Time: Location: [facility]

## 2023-11-05 LAB — COMPREHENSIVE METABOLIC PANEL WITH GFR
Albumin: 4.4 (ref 3.5–5.0)
Calcium: 10.3 (ref 8.7–10.7)
eGFR: 85

## 2023-11-05 LAB — BASIC METABOLIC PANEL WITH GFR
BUN: 16 (ref 4–21)
CO2: 26 — AB (ref 13–22)
Chloride: 100 (ref 99–108)
Creatinine: 1 (ref 0.6–1.3)
Glucose: 110
Potassium: 3.9 meq/L (ref 3.5–5.1)
Sodium: 138 (ref 137–147)

## 2023-11-05 LAB — MICROALBUMIN / CREATININE URINE RATIO: Microalb Creat Ratio: 25

## 2023-11-05 LAB — PROTEIN / CREATININE RATIO, URINE
Albumin, U: 1.8
Creatinine, Urine: 72.1

## 2023-11-17 ENCOUNTER — Telehealth: Payer: Self-pay

## 2023-11-17 NOTE — Telephone Encounter (Signed)
The patient called in because he called ENDO about his time for his procedure for tomorrow. I had to get Johny Drilling and Ginger to help the patient.

## 2023-11-17 NOTE — Telephone Encounter (Signed)
Patient was moved to 11/18/2023 and given arrival time of 12 pm

## 2023-11-18 ENCOUNTER — Ambulatory Visit
Admission: RE | Admit: 2023-11-18 | Discharge: 2023-11-18 | Disposition: A | Discharge status: Home / Self Care | Payer: HMO | Attending: Gastroenterology | Admitting: Gastroenterology

## 2023-11-18 ENCOUNTER — Ambulatory Visit: Payer: HMO | Admitting: Anesthesiology

## 2023-11-18 ENCOUNTER — Encounter: Payer: Self-pay | Admitting: Gastroenterology

## 2023-11-18 ENCOUNTER — Encounter
Admission: RE | Disposition: A | Discharge status: Home / Self Care | Payer: Self-pay | Source: Home / Self Care | Attending: Gastroenterology

## 2023-11-18 DIAGNOSIS — Z7984 Long term (current) use of oral hypoglycemic drugs: Secondary | ICD-10-CM | POA: Diagnosis not present

## 2023-11-18 DIAGNOSIS — N182 Chronic kidney disease, stage 2 (mild): Secondary | ICD-10-CM | POA: Insufficient documentation

## 2023-11-18 DIAGNOSIS — I129 Hypertensive chronic kidney disease with stage 1 through stage 4 chronic kidney disease, or unspecified chronic kidney disease: Secondary | ICD-10-CM | POA: Insufficient documentation

## 2023-11-18 DIAGNOSIS — Z1211 Encounter for screening for malignant neoplasm of colon: Secondary | ICD-10-CM | POA: Diagnosis present

## 2023-11-18 DIAGNOSIS — E1122 Type 2 diabetes mellitus with diabetic chronic kidney disease: Secondary | ICD-10-CM | POA: Diagnosis not present

## 2023-11-18 DIAGNOSIS — D126 Benign neoplasm of colon, unspecified: Secondary | ICD-10-CM

## 2023-11-18 DIAGNOSIS — D125 Benign neoplasm of sigmoid colon: Secondary | ICD-10-CM | POA: Insufficient documentation

## 2023-11-18 DIAGNOSIS — J45909 Unspecified asthma, uncomplicated: Secondary | ICD-10-CM | POA: Insufficient documentation

## 2023-11-18 DIAGNOSIS — Z87891 Personal history of nicotine dependence: Secondary | ICD-10-CM | POA: Diagnosis not present

## 2023-11-18 HISTORY — PX: COLONOSCOPY WITH PROPOFOL: SHX5780

## 2023-11-18 HISTORY — PX: POLYPECTOMY: SHX5525

## 2023-11-18 SURGERY — COLONOSCOPY WITH PROPOFOL
Anesthesia: General

## 2023-11-18 MED ORDER — PROPOFOL 10 MG/ML IV BOLUS
INTRAVENOUS | Status: DC | PRN
  Administered 2023-11-18 (×2): 50 mg via INTRAVENOUS

## 2023-11-18 MED ORDER — LIDOCAINE HCL (CARDIAC) PF 100 MG/5ML IV SOSY
PREFILLED_SYRINGE | INTRAVENOUS | Status: DC | PRN
  Administered 2023-11-18: 70 mg via INTRAVENOUS

## 2023-11-18 MED ORDER — PROPOFOL 500 MG/50ML IV EMUL
INTRAVENOUS | Status: DC | PRN
  Administered 2023-11-18: 75 ug/kg/min via INTRAVENOUS

## 2023-11-18 MED ORDER — LIDOCAINE HCL (PF) 2 % IJ SOLN
INTRAMUSCULAR | Status: AC
  Filled 2023-11-18: qty 5

## 2023-11-18 MED ORDER — DEXMEDETOMIDINE HCL IN NACL 80 MCG/20ML IV SOLN
INTRAVENOUS | Status: DC | PRN
Start: 2023-11-18 — End: 2023-11-18
  Administered 2023-11-18: 20 ug via INTRAVENOUS

## 2023-11-18 MED ORDER — SODIUM CHLORIDE 0.9 % IV SOLN: INTRAVENOUS | Status: DC

## 2023-11-18 MED ORDER — PROPOFOL 1000 MG/100ML IV EMUL
INTRAVENOUS | Status: AC
  Filled 2023-11-18: qty 100

## 2023-11-18 NOTE — Op Note (Signed)
[facility] Patient Name: Jason Hammond Procedure Date: 11/18/2023 1:11 PM MRN: [id number] Account #: [id number] Date of Birth: 1950-12-01 Admit Type: Outpatient Age: 73 Room: [facility] ENDO ROOM 3 Gender: Male Note Status: Finalized Instrument Name: Nelda Marseille [id number] Procedure:             Colonoscopy Indications:           Screening for colorectal malignant neoplasm Providers:             Wyline Mood MD, MD Medicines:             Monitored Anesthesia Care Complications:         No immediate complications. Procedure:             Pre-Anesthesia Assessment:                        - Prior to the procedure, a History and Physical was                         performed, and patient medications, allergies and                         sensitivities were reviewed. The patient's tolerance                         of previous anesthesia was reviewed.                        - The risks and benefits of the procedure and the                         sedation options and risks were discussed with the                         patient. All questions were answered and informed                         consent was obtained.                        - ASA Grade Assessment: II - A patient with mild                         systemic disease.                        After obtaining informed consent, the colonoscope was                         passed under direct vision. Throughout the procedure,                         the patient's blood pressure, pulse, and oxygen                         saturations were monitored continuously. The                         Colonoscope was introduced through the anus and  advanced to the the cecum, identified by the                         appendiceal orifice. The colonoscopy was performed                         with ease. The patient tolerated the procedure well.                         The quality of the bowel  preparation was excellent.                         The ileocecal valve, appendiceal orifice, and rectum                         were photographed. Findings:      The perianal and digital rectal examinations were normal.      A 5 mm polyp was found in the sigmoid colon. The polyp was sessile. The       polyp was removed with a cold snare. Resection and retrieval were       complete.      The exam was otherwise without abnormality on direct and retroflexion       views. Impression:            - One 5 mm polyp in the sigmoid colon, removed with a                         cold snare. Resected and retrieved.                        - The examination was otherwise normal on direct and                         retroflexion views. Recommendation:        - Discharge patient to home (with escort).                        - Resume previous diet.                        - Continue present medications.                        - Await pathology results.                        - Repeat colonoscopy for surveillance based on                         pathology results. Procedure Code(s):     --- Professional ---                        [phone], Colonoscopy, flexible; with removal of                         tumor(s), polyp(s), or other lesion(s) by snare                         technique Diagnosis Code(s):     ---  Professional ---                        Z12.11, Encounter for screening for malignant neoplasm                         of colon                        D12.5, Benign neoplasm of sigmoid colon CPT copyright 2022 American [employer] Association. All rights reserved. The codes documented in this report are preliminary and upon coder review may  be revised to meet current compliance requirements. Wyline Mood, MD Wyline Mood MD, MD 11/18/2023 1:41:16 PM This report has been signed electronically. Number of Addenda: 0 Note Initiated On: 11/18/2023 1:11 PM Scope Withdrawal Time: 0 hours 9 minutes 22 seconds   Total Procedure Duration: 0 hours 11 minutes 45 seconds  Estimated Blood Loss:  Estimated blood loss: none.      [facility]

## 2023-11-18 NOTE — Anesthesia Preprocedure Evaluation (Signed)
Anesthesia Evaluation  Patient identified by MRN, date of birth, ID band Patient awake    Reviewed: Allergy & Precautions, NPO status , Patient's Chart, lab work & pertinent test results  History of Anesthesia Complications Negative for: history of anesthetic complications  Airway Mallampati: III  TM Distance: >3 FB Neck ROM: Full    Dental  (+) Missing, Chipped   Pulmonary asthma , neg sleep apnea, neg COPD, Patient abstained from smoking.Not current smoker, former smoker   Pulmonary exam normal breath sounds clear to auscultation       Cardiovascular Exercise Tolerance: Good METShypertension, Pt. 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/min 2020: COVID-19     Comment:  severe while in [place]-since then has had some sob               and is seeing dr Karna Christmas No date: DM (diabetes mellitus), type 2 (HCC) No date: Hypertension No date: Incarcerated umbilical hernia No date: Reactive airway disease  Reproductive/Obstetrics                             Anesthesia Physical Anesthesia Plan  ASA: 2  Anesthesia Plan: General   Post-op Pain Management: Minimal or no pain anticipated   Induction: Intravenous  PONV Risk Score and Plan: 2 and Propofol infusion, TIVA and Ondansetron  Airway Management Planned: Nasal Cannula  Additional Equipment: None  Intra-op Plan:   Post-operative Plan:   Informed  Consent: I have reviewed the patients History and Physical, chart, labs and discussed the procedure including the risks, benefits and alternatives for the proposed anesthesia with the patient or authorized representative who has indicated his/her understanding and acceptance.     Dental advisory given  Plan Discussed with: CRNA and Surgeon  Anesthesia Plan Comments: (Discussed risks of anesthesia with patient, including possibility of difficulty with spontaneous ventilation under anesthesia necessitating airway intervention, PONV, and rare risks such as cardiac or respiratory or neurological events, and allergic reactions. Discussed the role of CRNA in patient's perioperative care. Patient understands.)        Anesthesia Quick Evaluation

## 2023-11-18 NOTE — H&P (Signed)
Wyline Mood, MD [address], [address], [place], [place], [address] [address], [place], [place], [address] Phone: [phone]  Fax: [phone]  Primary Care Physician:  Sherrie Mustache, MD   Pre-Procedure History & Physical: HPI:  Erica Roeth is a 73 y.o. male is here for an colonoscopy.   Past Medical History:  Diagnosis Date   CKD (chronic kidney disease) stage 2, GFR 60-89 ml/min    COVID-19 2020   severe while in [place]-since then has had some sob and is seeing dr Karna Christmas   DM (diabetes mellitus), type 2 (HCC)    Hypertension    Incarcerated umbilical hernia    Reactive airway disease     Past Surgical History:  Procedure Laterality Date   COLONOSCOPY WITH PROPOFOL N/A 10/09/2015   Procedure: COLONOSCOPY WITH PROPOFOL;  Surgeon: Midge Minium, MD;  Location: [facility] ENDOSCOPY;  Service: Endoscopy;  Laterality: N/A;   INGUINAL HERNIA REPAIR Left     Prior to Admission medications   Medication Sig Start Date End Date Taking? Authorizing Provider  acetaminophen (TYLENOL) 500 MG tablet Take 2 tablets (1,000 mg total) by mouth every 6 (six) hours as needed for mild pain. 05/05/23   Henrene Dodge, MD  aspirin EC 81 MG tablet 81 mg daily.    [provider]  atorvastatin (LIPITOR) 10 MG tablet Take 10 mg by mouth daily. 10/09/23   [provider]  cetirizine (ZYRTEC) 10 MG tablet Take by mouth. 04/17/22   [provider]  Cholecalciferol (VITAMIN D3) 1.25 MG (50000 UT) CAPS Take 1 capsule by mouth once a week. 09/29/23   [provider]  Fenofibrate 40 MG TABS Take 40 mg by mouth every morning.    [provider]  ibuprofen (ADVIL) 600 MG tablet Take 1 tablet (600 mg total) by mouth every 8 (eight) hours as needed for moderate pain. 05/05/23   Piscoya, Elita Quick, MD  ipratropium-albuterol (DUONEB) 0.5-2.5 (3) MG/3ML SOLN Take 3 mLs by nebulization 2 (two) times daily.    [provider]  JARDIANCE 10 MG TABS tablet  Take 10 mg by mouth every morning. Patient not taking: Reported on 10/19/2023    [provider]  losartan (COZAAR) 25 MG tablet Take 25 mg by mouth 2 (two) times daily.    [provider]  metFORMIN (GLUCOPHAGE) 1000 MG tablet Take 1,000 mg by mouth 2 (two) times daily.    [provider]    Allergies as of 10/19/2023   (No Known Allergies)    No family history on file.  Social History   Socioeconomic History   Marital status: Married    Spouse name: Not on file   Number of children: Not on file   Years of education: Not on file   Highest education level: Not on file  Occupational History   Not on file  Tobacco Use   Smoking status: Former    Types: Cigarettes   Smokeless tobacco: Never  Vaping Use   Vaping status: Never Used  Substance and Sexual Activity   Alcohol use: Yes    Comment: 2-3 beers nightly   Drug use: No   Sexual activity: Not on file  Other Topics Concern   Not on file  Social History Narrative   Not on file   Social Drivers of Health   Financial Resource Strain: Not on file  Food Insecurity: Not on file  Transportation Needs: Not on file  Physical Activity: Not on file  Stress: Not  on file  Social Connections: Not on file  Intimate Partner Violence: Not on file    Review of Systems: See HPI, otherwise negative ROS  Physical Exam: There were no vitals taken for this visit. General:   Alert,  pleasant and cooperative in NAD Head:  Normocephalic and atraumatic. Neck:  Supple; no masses or thyromegaly. Lungs:  Clear throughout to auscultation, normal respiratory effort.    Heart:  +S1, +S2, Regular rate and rhythm, No edema. Abdomen:  Soft, nontender and nondistended. Normal bowel sounds, without guarding, and without rebound.   Neurologic:  Alert and  oriented x4;  grossly normal neurologically.  Impression/Plan: Shanta Keehan is here for an colonoscopy to be performed for surveillance due to prior history of  colon polyps   Risks, benefits, limitations, and alternatives regarding  colonoscopy have been reviewed with the patient.  Questions have been answered.  All parties agreeable.   Wyline Mood, MD  11/18/2023, 12:23 PM

## 2023-11-18 NOTE — Telephone Encounter (Signed)
Do this patient need a PA.

## 2023-11-18 NOTE — Transfer of Care (Signed)
Immediate Anesthesia Transfer of Care Note  Patient: Jason Hammond  Procedure(s) Performed: COLONOSCOPY WITH PROPOFOL POLYPECTOMY  Patient Location: PACU  Anesthesia Type:General  Level of Consciousness: sedated  Airway & Oxygen Therapy: Patient Spontanous Breathing  Post-op Assessment: Report given to RN and Post -op Vital signs reviewed and stable  Post vital signs: Reviewed and stable  Last Vitals:  Vitals Value Taken Time  BP 110/67 11/18/23 1344  Temp    Pulse 77 11/18/23 1344  Resp 24 11/18/23 1344  SpO2 92 % 11/18/23 1344  Vitals shown include unfiled device data.  Last Pain:  Vitals:   11/18/23 1235  TempSrc: Temporal         Complications: No notable events documented.

## 2023-11-18 NOTE — Anesthesia Postprocedure Evaluation (Signed)
Anesthesia Post Note  Patient: Jason Hammond  Procedure(s) Performed: COLONOSCOPY WITH PROPOFOL POLYPECTOMY  Patient location during evaluation: PACU Anesthesia Type: General Level of consciousness: awake and alert Pain management: pain level controlled Vital Signs Assessment: post-procedure vital signs reviewed and stable Respiratory status: spontaneous breathing, nonlabored ventilation, respiratory function stable and patient connected to nasal cannula oxygen Cardiovascular status: blood pressure returned to baseline and stable Postop Assessment: no apparent nausea or vomiting Anesthetic complications: no   No notable events documented.   Last Vitals:  Vitals:   11/18/23 1235 11/18/23 1343  BP: (!) 141/85 110/67  Pulse: 85   Resp: 16   Temp: 36.6 C 37 C  SpO2: 97%     Last Pain:  Vitals:   11/18/23 1343  TempSrc: Temporal                 Corinda Gubler

## 2023-11-19 ENCOUNTER — Encounter: Payer: Self-pay | Admitting: Gastroenterology

## 2023-11-19 LAB — SURGICAL PATHOLOGY

## 2023-11-20 ENCOUNTER — Encounter: Payer: Self-pay | Admitting: Gastroenterology

## 2023-12-23 ENCOUNTER — Other Ambulatory Visit: Payer: Self-pay | Admitting: Internal Medicine

## 2023-12-23 DIAGNOSIS — R0609 Other forms of dyspnea: Secondary | ICD-10-CM

## 2023-12-23 DIAGNOSIS — R9431 Abnormal electrocardiogram [ECG] [EKG]: Secondary | ICD-10-CM

## 2023-12-23 DIAGNOSIS — I200 Unstable angina: Secondary | ICD-10-CM

## 2023-12-23 LAB — LAB REPORT - SCANNED
A1c: 6.2
EGFR: 89

## 2024-01-18 ENCOUNTER — Ambulatory Visit (INDEPENDENT_AMBULATORY_CARE_PROVIDER_SITE_OTHER)

## 2024-01-18 DIAGNOSIS — I200 Unstable angina: Secondary | ICD-10-CM

## 2024-01-18 DIAGNOSIS — R0609 Other forms of dyspnea: Secondary | ICD-10-CM | POA: Diagnosis not present

## 2024-01-18 DIAGNOSIS — R9431 Abnormal electrocardiogram [ECG] [EKG]: Secondary | ICD-10-CM

## 2024-01-25 MED ORDER — TECHNETIUM TC 99M SESTAMIBI GENERIC - CARDIOLITE
10.7000 | Freq: Once | INTRAVENOUS | Status: AC | PRN
  Administered 2024-01-25: 10.7 via INTRAVENOUS

## 2024-01-25 MED ORDER — TECHNETIUM TC 99M SESTAMIBI GENERIC - CARDIOLITE
30.8000 | Freq: Once | INTRAVENOUS | Status: AC | PRN
  Administered 2024-01-25: 30.8 via INTRAVENOUS

## 2024-03-26 LAB — LAB REPORT - SCANNED
A1c: 6.2
EGFR: 94

## 2024-06-15 ENCOUNTER — Other Ambulatory Visit: Payer: Self-pay | Admitting: Family Medicine

## 2024-06-15 NOTE — Telephone Encounter (Signed)
Patient has never been seen at this office.

## 2024-06-22 ENCOUNTER — Ambulatory Visit: Admitting: Family Medicine

## 2024-06-22 NOTE — Progress Notes (Deleted)
 New patient visit   Patient: Jason Hammond   DOB: August 11, 1951   73 y.o. Male  MRN: [id number] Visit Date: 06/22/2024  Today's healthcare provider: Rockie Agent, MD   No chief complaint on file.  Subjective    Jason Hammond is a 73 y.o. male who presents today as a new patient to establish care.      Discussed the use of AI scribe software for clinical note transcription with the patient, who gave verbal consent to proceed.  History of Present Illness       Past Medical History:  Diagnosis Date   CKD (chronic kidney disease) stage 2, GFR 60-89 ml/min    COVID-19 2020   severe while in [place] -since then has had some sob and is seeing dr parris   DM (diabetes mellitus), type 2 (HCC)    Hypertension    Incarcerated umbilical hernia    Reactive airway disease     Outpatient Medications Prior to Visit  Medication Sig   acetaminophen  (TYLENOL ) 500 MG tablet Take 2 tablets (1,000 mg total) by mouth every 6 (six) hours as needed for mild pain.   aspirin EC 81 MG tablet 81 mg daily.   atorvastatin (LIPITOR) 10 MG tablet Take 10 mg by mouth daily.   cetirizine (ZYRTEC) 10 MG tablet Take by mouth. (Patient not taking: Reported on 11/18/2023)   Cholecalciferol (VITAMIN D3) 1.25 MG (50000 UT) CAPS Take 1 capsule by mouth once a week.   Fenofibrate 40 MG TABS Take 40 mg by mouth every morning. (Patient not taking: Reported on 11/18/2023)   ibuprofen  (ADVIL ) 600 MG tablet Take 1 tablet (600 mg total) by mouth every 8 (eight) hours as needed for moderate pain. (Patient not taking: Reported on 11/18/2023)   ipratropium-albuterol  (DUONEB) 0.5-2.5 (3) MG/3ML SOLN Take 3 mLs by nebulization 2 (two) times daily.   JARDIANCE 10 MG TABS tablet Take 10 mg by mouth every morning. (Patient not taking: Reported on 10/19/2023)   losartan (COZAAR) 25 MG tablet Take 25 mg by mouth 2 (two) times daily.   metFORMIN (GLUCOPHAGE) 1000 MG tablet Take 1,000 mg by mouth 2 (two) times  daily.   No facility-administered medications prior to visit.    Past Surgical History:  Procedure Laterality Date   COLONOSCOPY WITH PROPOFOL  N/A 10/09/2015   Procedure: COLONOSCOPY WITH PROPOFOL ;  Surgeon: Rogelia Copping, MD;  Location: [facility] ENDOSCOPY;  Service: Endoscopy;  Laterality: N/A;   COLONOSCOPY WITH PROPOFOL  N/A 11/18/2023   Procedure: COLONOSCOPY WITH PROPOFOL ;  Surgeon: Therisa Bi, MD;  Location: [facility] ENDOSCOPY;  Service: Gastroenterology;  Laterality: N/A;   INGUINAL HERNIA REPAIR Left    POLYPECTOMY  11/18/2023   Procedure: POLYPECTOMY;  Surgeon: Therisa Bi, MD;  Location: [facility] ENDOSCOPY;  Service: Gastroenterology;;   No family status information on file.   No family history on file. Social History   Socioeconomic History   Marital status: Married    Spouse name: Not on file   Number of children: Not on file   Years of education: Not on file   Highest education level: Not on file  Occupational History   Not on file  Tobacco Use   Smoking status: Former    Types: Cigarettes   Smokeless tobacco: Never  Vaping Use   Vaping status: Never Used  Substance and Sexual Activity   Alcohol use: Yes    Comment: 2-3 beers nightly   Drug use: No   Sexual activity: Not on file  Other Topics Concern  Not on file  Social History Narrative   Not on file   Social Drivers of Health   Financial Resource Strain: Not on file  Food Insecurity: Not on file  Transportation Needs: Not on file  Physical Activity: Not on file  Stress: Not on file  Social Connections: Not on file     No Known Allergies   There is no immunization history on file for this patient.  Health Maintenance  Topic Date Due   Medicare Annual Wellness (AWV)  Never done   Diabetic kidney evaluation - Urine ACR  Never done   Hepatitis C Screening  Never done   DTaP/Tdap/Td (1 - Tdap) Never done   Pneumococcal Vaccine: 50+ Years (1 of 2 - PCV) Never done   Zoster Vaccines- Shingrix (1 of 2)  Never done   COVID-19 Vaccine (1 - 2024-25 season) Never done   Diabetic kidney evaluation - eGFR measurement  04/15/2024   INFLUENZA VACCINE  05/27/2024   Colonoscopy  11/17/2028   HPV VACCINES  Aged Out   Meningococcal B Vaccine  Aged Out    Patient Care Team: Weyman Bright, MD as PCP - General (Internal Medicine)  Review of Systems  Last metabolic panel Lab Results  Component Value Date   GLUCOSE 205 (H) 04/16/2023   NA 136 04/16/2023   K 3.9 04/16/2023   CL 101 04/16/2023   CO2 23 04/16/2023   BUN 19 04/16/2023   CREATININE 1.01 04/16/2023   GFRNONAA >60 04/16/2023   CALCIUM 9.1 04/16/2023   ANIONGAP 12 04/16/2023   Last lipids No results found for: CHOL, HDL, LDLCALC, LDLDIRECT, TRIG, CHOLHDL Last hemoglobin A1c No results found for: HGBA1C Last thyroid functions No results found for: TSH, T3TOTAL, T4TOTAL, THYROIDAB   {See past labs  Heme  Chem  Endocrine  Serology  Results Review (optional):1}   Objective    There were no vitals taken for this visit. BP Readings from Last 3 Encounters:  11/18/23 131/73  05/19/23 132/79  05/05/23 (!) 170/83   Wt Readings from Last 3 Encounters:  11/18/23 171 lb (77.6 kg)  05/19/23 174 lb 3.2 oz (79 kg)  05/05/23 172 lb 9.9 oz (78.3 kg)    {See vitals history (optional):1}    Depression Screen     No data to display         No results found for any visits on 06/22/24.   Physical Exam ***    Assessment & Plan      Problem List Items Addressed This Visit   None   Assessment and Plan Assessment & Plan       No follow-ups on file.      Rockie Agent, MD  [facility] [phone] (phone) [phone] (fax)  [facility]

## 2024-06-28 ENCOUNTER — Other Ambulatory Visit: Payer: Self-pay | Admitting: Family Medicine

## 2024-07-01 ENCOUNTER — Encounter: Payer: Self-pay | Admitting: Family Medicine

## 2024-07-01 ENCOUNTER — Other Ambulatory Visit: Payer: Self-pay | Admitting: Family Medicine

## 2024-07-01 ENCOUNTER — Ambulatory Visit (INDEPENDENT_AMBULATORY_CARE_PROVIDER_SITE_OTHER): Payer: Self-pay | Admitting: Family Medicine

## 2024-07-01 VITALS — BP 126/78 | HR 71 | Resp 16 | Ht 68.0 in | Wt 170.0 lb

## 2024-07-01 DIAGNOSIS — Z23 Encounter for immunization: Secondary | ICD-10-CM

## 2024-07-01 DIAGNOSIS — E781 Pure hyperglyceridemia: Secondary | ICD-10-CM

## 2024-07-01 DIAGNOSIS — L602 Onychogryphosis: Secondary | ICD-10-CM

## 2024-07-01 DIAGNOSIS — E1159 Type 2 diabetes mellitus with other circulatory complications: Secondary | ICD-10-CM

## 2024-07-01 DIAGNOSIS — E119 Type 2 diabetes mellitus without complications: Secondary | ICD-10-CM

## 2024-07-01 DIAGNOSIS — M5126 Other intervertebral disc displacement, lumbar region: Secondary | ICD-10-CM

## 2024-07-01 DIAGNOSIS — E1169 Type 2 diabetes mellitus with other specified complication: Secondary | ICD-10-CM

## 2024-07-01 DIAGNOSIS — M5416 Radiculopathy, lumbar region: Secondary | ICD-10-CM

## 2024-07-01 DIAGNOSIS — U099 Post covid-19 condition, unspecified: Secondary | ICD-10-CM

## 2024-07-01 DIAGNOSIS — I152 Hypertension secondary to endocrine disorders: Secondary | ICD-10-CM

## 2024-07-01 DIAGNOSIS — E785 Hyperlipidemia, unspecified: Secondary | ICD-10-CM

## 2024-07-01 DIAGNOSIS — E559 Vitamin D deficiency, unspecified: Secondary | ICD-10-CM

## 2024-07-01 MED ORDER — IPRATROPIUM-ALBUTEROL 0.5-2.5 (3) MG/3ML IN SOLN: 3.0000 mL | Freq: Two times a day (BID) | RESPIRATORY_TRACT | 2 refills | Status: DC

## 2024-07-01 MED ORDER — LOSARTAN POTASSIUM 25 MG PO TABS: 25.0000 mg | ORAL_TABLET | Freq: Two times a day (BID) | ORAL | 2 refills | Status: AC

## 2024-07-01 MED ORDER — FENOFIBRATE 40 MG PO TABS: 40.0000 mg | ORAL_TABLET | ORAL | 3 refills | Status: DC

## 2024-07-01 MED ORDER — ATORVASTATIN CALCIUM 10 MG PO TABS
10.0000 mg | ORAL_TABLET | Freq: Every day | ORAL | 3 refills | Status: AC
Start: 2024-07-01 — End: ?

## 2024-07-01 MED ORDER — METFORMIN HCL 1000 MG PO TABS
1000.0000 mg | ORAL_TABLET | Freq: Two times a day (BID) | ORAL | 2 refills | Status: AC
Start: 2024-07-01 — End: ?

## 2024-07-01 NOTE — Progress Notes (Signed)
 New patient visit   Patient: Jason Hammond   DOB: [birth year]   73 y.o. Male  MRN: [id number] Visit Date: 07/01/2024  Today's healthcare provider: Rockie Agent, MD   Chief Complaint  Patient presents with   New Patient (Initial Visit)    New patient to establish care.   Subjective    Jason Hammond is a 73 y.o. male who presents today as a new patient to establish care.   HPI     New Patient (Initial Visit)    Additional comments: New patient to establish care.      Last edited by Rosas, Joseline E, CMA on 07/01/2024 10:25 AM.       Discussed the use of AI scribe software for clinical note transcription with the patient, who gave verbal consent to proceed.  History of Present Illness Jason Hammond is a 73 year old male with a history of back surgery, well controlled type 2 DM, long covid, HTN and HLD who presents with worsening back pain and mobility issues and to establish care with new PCP  He experiences significant back pain that prevents him from engaging in activities like gardening and requires him to stand while working. He is unable to squat or bend down as he used to, and if he does, he struggles to get back up. The pain originates in the back, radiates to the knees, and sometimes feels like 'somebody is using an ax' at night, making it difficult to sleep. He wakes up around 8:00 AM and often needs to lie down again after having coffee due to poor sleep quality.  He visits a chiropractor every other week for adjustments and massages, which provide some relief. He underwent back surgery over ten years ago due to vertebrae slipping off each other, initially misdiagnosed as muscle spasms. An MRI eventually revealed the severity, leading to surgery. Recovery took about three and a half years. He was informed that the lower vertebrae might eventually require another surgery.  He takes atorvastatin  10 mg for elevated cholesterol, losartan  25 mg for blood  pressure, and metformin  1000 mg twice daily for well controlled diabetes. His cholesterol levels are monitored, with triglycerides previously noted to be elevated at 235 mg/dL, levels were 8799 10 years ago  He uses a nebulizer daily following a significant drop in lung function to 42% after contracting COVID-19 in January 2020. No current breathing issues are reported, but he continues the treatment as prescribed by a pulmonologist.  He denies smoking and clarifies that he has never used tobacco.    Past Medical History:  Diagnosis Date   CKD (chronic kidney disease) stage 2, GFR 60-89 ml/min    COVID-19 2020   severe while in [place] -since then has had some sob and is seeing dr parris   DM (diabetes mellitus), type 2 (HCC)    Hypertension    Incarcerated umbilical hernia    Reactive airway disease     Outpatient Medications Prior to Visit  Medication Sig   acetaminophen  (TYLENOL ) 500 MG tablet Take 2 tablets (1,000 mg total) by mouth every 6 (six) hours as needed for mild pain.   aspirin EC 81 MG tablet 81 mg daily.   Cholecalciferol (VITAMIN D3) 1.25 MG (50000 UT) CAPS Take 1 capsule by mouth once a week.   CINNAMON PO Take by mouth.   JARDIANCE 10 MG TABS tablet Take 10 mg by mouth every morning.   Misc Natural Products (TURMERIC, CURCUMIN, PO) Take by mouth.  cetirizine (ZYRTEC) 10 MG tablet Take by mouth. (Patient not taking: Reported on 11/18/2023)   ibuprofen  (ADVIL ) 600 MG tablet Take 1 tablet (600 mg total) by mouth every 8 (eight) hours as needed for moderate pain. (Patient not taking: Reported on 11/18/2023)   [DISCONTINUED] atorvastatin  (LIPITOR) 10 MG tablet Take 10 mg by mouth daily.   [DISCONTINUED] Fenofibrate  40 MG TABS Take 40 mg by mouth every morning. (Patient not taking: Reported on 11/18/2023)   [DISCONTINUED] ipratropium-albuterol  (DUONEB) 0.5-2.5 (3) MG/3ML SOLN Take 3 mLs by nebulization 2 (two) times daily.   [DISCONTINUED] losartan  (COZAAR ) 25 MG tablet  Take 25 mg by mouth 2 (two) times daily.   [DISCONTINUED] metFORMIN  (GLUCOPHAGE ) 1000 MG tablet Take 1,000 mg by mouth 2 (two) times daily.   No facility-administered medications prior to visit.    Past Surgical History:  Procedure Laterality Date   BACK SURGERY Left    COLONOSCOPY WITH PROPOFOL  N/A 10/09/2015   Procedure: COLONOSCOPY WITH PROPOFOL ;  Surgeon: Rogelia Copping, MD;  Location: [facility] ENDOSCOPY;  Service: Endoscopy;  Laterality: N/A;   COLONOSCOPY WITH PROPOFOL  N/A 11/18/2023   Procedure: COLONOSCOPY WITH PROPOFOL ;  Surgeon: Therisa Bi, MD;  Location: [facility] ENDOSCOPY;  Service: Gastroenterology;  Laterality: N/A;   INGUINAL HERNIA REPAIR Left    POLYPECTOMY  11/18/2023   Procedure: POLYPECTOMY;  Surgeon: Therisa Bi, MD;  Location: [facility] ENDOSCOPY;  Service: Gastroenterology;;   No family status information on file.   History reviewed. No pertinent family history. Social History   Socioeconomic History   Marital status: Married    Spouse name: Not on file   Number of children: Not on file   Years of education: Not on file   Highest education level: Not on file  Occupational History   Not on file  Tobacco Use   Smoking status: Former    Types: Cigarettes   Smokeless tobacco: Never  Vaping Use   Vaping status: Never Used  Substance and Sexual Activity   Alcohol use: Yes    Comment: 2-3 beers nightly   Drug use: No   Sexual activity: Not on file  Other Topics Concern   Not on file  Social History Narrative   Not on file   Social Drivers of Health   Financial Resource Strain: Not on file  Food Insecurity: Not on file  Transportation Needs: Not on file  Physical Activity: Not on file  Stress: Not on file  Social Connections: Not on file     No Known Allergies  Immunization History  Administered Date(s) Administered   INFLUENZA, HIGH DOSE SEASONAL PF 07/01/2024    Health Maintenance  Topic Date Due   Medicare Annual Wellness (AWV)  Never done   FOOT  EXAM  Never done   OPHTHALMOLOGY EXAM  Never done   Hepatitis C Screening  Never done   DTaP/Tdap/Td (1 - Tdap) Never done   Pneumococcal Vaccine: 50+ Years (1 of 2 - PCV) Never done   Zoster Vaccines- Shingrix (1 of 2) Never done   COVID-19 Vaccine (1 - 2024-25 season) Never done   Diabetic kidney evaluation - Urine ACR  11/04/2024   HEMOGLOBIN A1C  12/29/2024   Diabetic kidney evaluation - eGFR measurement  07/01/2025   Colonoscopy  11/17/2028   Influenza Vaccine  Completed   HPV VACCINES  Aged Out   Meningococcal B Vaccine  Aged Out    Patient Care Team: Sharma Coyer, MD as PCP - General (Family Medicine)  Review of Systems  Results LABS Creatinine: 0.92 (12/2023) Potassium: 4.5 (12/2023) Calcium : 9.4 (12/2023) Total Cholesterol: 189 (12/2023) Triglycerides: 235 (12/2023) HDL: 57 (12/2023) LDL: 93 (12/2023) A1c: 6.2 (12/2023) Vitamin D : 62.7 (12/2023)  RADIOLOGY Lumbar Spine MRI: Disc bulge L4-L5, not able to walk (2012) Right-sided extra foraminal disc herniation with right-sided L4 radiculopathy (2012) Left-sided disc herniation L5-S1, asymptomatic (2012)  DIAGNOSTIC Colonoscopy: Recent, no details provided (01/2024)      Objective    BP 126/78 (BP Location: Left Arm, Patient Position: Sitting, Cuff Size: Normal)   Pulse 71   Resp 16   Ht 5' 8 (1.727 m)   Wt 170 lb (77.1 kg)   SpO2 96%   BMI 25.85 kg/m      Depression Screen    07/01/2024   10:29 AM  PHQ 2/9 Scores  PHQ - 2 Score 3  PHQ- 9 Score 4   Results for orders placed or performed in visit on 07/01/24  Microalbumin / creatinine urine ratio  Result Value Ref Range   Microalb Creat Ratio 25   Protein / creatinine ratio, urine  Result Value Ref Range   Creatinine, Urine 72.1    Albumin, U 1.8   Basic metabolic panel with GFR  Result Value Ref Range   Glucose 110    BUN 16 4 - 21   CO2 26 (A) 13 - 22   Creatinine 1.0 0.6 - 1.3   Potassium 3.9 3.5 - 5.1 mEq/L    Sodium 138 137 - 147   Chloride 100 99 - 108  Comprehensive metabolic panel with GFR  Result Value Ref Range   eGFR 85    Calcium  10.3 8.7 - 10.7   Albumin 4.4 3.5 - 5.0  Results for orders placed or performed in visit on 07/01/24  Hemoglobin A1c  Result Value Ref Range   Hgb A1c MFr Bld 6.2 (H) 4.8 - 5.6 %   Est. average glucose Bld gHb Est-mCnc 131 mg/dL  RFE85+ZHQM  Result Value Ref Range   Glucose 106 (H) 70 - 99 mg/dL   BUN 15 8 - 27 mg/dL   Creatinine, Ser 8.99 0.76 - 1.27 mg/dL   eGFR 80 >40 fO/fpw/8.26   BUN/Creatinine Ratio 15 10 - 24   Sodium 139 134 - 144 mmol/L   Potassium 4.3 3.5 - 5.2 mmol/L   Chloride 100 96 - 106 mmol/L   CO2 22 20 - 29 mmol/L   Calcium  9.4 8.6 - 10.2 mg/dL   Total Protein 7.3 6.0 - 8.5 g/dL   Albumin 4.6 3.8 - 4.8 g/dL   Globulin, Total 2.7 1.5 - 4.5 g/dL   Bilirubin Total 0.5 0.0 - 1.2 mg/dL   Alkaline Phosphatase 50 44 - 121 IU/L   AST 16 0 - 40 IU/L   ALT 12 0 - 44 IU/L  Lipid panel  Result Value Ref Range   Cholesterol, Total 160 100 - 199 mg/dL   Triglycerides 800 (H) 0 - 149 mg/dL   HDL 54 >60 mg/dL   VLDL Cholesterol Cal 33 5 - 40 mg/dL   LDL Chol Calc (NIH) 73 0 - 99 mg/dL   Chol/HDL Ratio 3.0 0.0 - 5.0 ratio  VITAMIN D  25 Hydroxy (Vit-D Deficiency, Fractures)  Result Value Ref Range   Vit D, 25-Hydroxy 62.8 30.0 - 100.0 ng/mL     Physical Exam Physical Exam CHEST: Clear to auscultation bilaterally, no wheezes or crackles. MUSCULOSKELETAL: Back examined, no tenderness to palpation, no deformities noted on visualization of the spine. Hips  examined, no groin pain on movement. Negative straight leg raise tests bilaterally. Pain and pressure on spine extension.      Assessment & Plan      Problem List Items Addressed This Visit     Hyperlipidemia associated with type 2 diabetes mellitus (HCC)   Mixed hyperlipidemia Chronic Mixed hyperlipidemia with total cholesterol of 189 mg/dL, triglycerides at 764 mg/dL, HDL at 57  mg/dL, and LDL at 93 mg/dL. Currently on atorvastatin  10 mg daily. Previous discontinuation of fenofibrate  due to dietary management of triglycerides. Discussed dietary influences on triglyceride levels and potential benefits of fenofibrate . - Order lipid panel - Continue atorvastatin  10 mg daily - Reinitiate fenofibrate  40 mg daily - Advise dietary modifications to reduce triglycerides      Relevant Medications   metFORMIN  (GLUCOPHAGE ) 1000 MG tablet   atorvastatin  (LIPITOR) 10 MG tablet   losartan  (COZAAR ) 25 MG tablet   Other Relevant Orders   CMP14+EGFR (Completed)   Lipid panel (Completed)   Hypertension associated with diabetes (HCC)   Hypertension Chronic, at goal Hypertension managed with losartan  25 mg twice daily. 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 No current respiratory symptoms reported. - Refill DuoNeb prescription      Relevant Medications   ipratropium-albuterol  (DUONEB) 0.5-2.5 (3) MG/3ML SOLN   Lumbar back pain with radiculopathy affecting left lower extremity - Primary   Lumbar radiculopathy  Chronic back pain with radiation to the knees, exacerbated at night, with a history of lumbar disc herniation and left-sided surgery over ten years ago. Differential includes herniated disc and spinal stenosis. Chiropractic adjustments provide temporary relief. - Submit referral to neurosurgery for evaluation - Review previous surgical records and imaging - Consider MRI to assess current spinal status      Relevant Orders    Ambulatory referral to Neurosurgery   Lumbar disc herniation (Chronic)   Thickened nails   Onychogryphosis of toenails Onychogryphosis causing difficulty with nail care. - Submit referral to podiatry for nail care and bunion evaluation      Relevant Orders   Ambulatory referral to Podiatry   Type 2 diabetes mellitus without complication, without long-term current use of insulin (HCC)   Type 2 Diabetes  Chronic & well controlled A1c of 6.2 and blood glucose levels between 101 and 129 mg/dL, managed with metformin  1000 mg twice daily. Lifestyle modifications discussed to improve glycemic control. - Order complete metabolic panel and A1c - Continue metformin  1000 mg twice daily - Encourage dietary modifications to stabilize blood glucose levels      Relevant Medications   metFORMIN  (GLUCOPHAGE ) 1000 MG tablet   atorvastatin  (LIPITOR) 10 MG tablet   losartan  (COZAAR ) 25 MG tablet   Other Relevant Orders   Hemoglobin A1c (Completed)   CMP14+EGFR (Completed)   Lipid panel (Completed)   Vitamin D  deficiency   Relevant Orders   VITAMIN D  25 Hydroxy (Vit-D Deficiency, Fractures) (Completed)   Other Visit Diagnoses       Immunization due       Relevant Orders   Flu vaccine HIGH DOSE PF(Fluzone  Trivalent) (Completed)        Assessment & Plan        Pt received influenza vaccine today      No follow-ups on file.      Rockie Agent, MD  [facility] [phone] (phone) [phone] (fax)  [facility]

## 2024-07-02 LAB — LIPID PANEL
Chol/HDL Ratio: 3 ratio (ref 0.0–5.0)
Cholesterol, Total: 160 mg/dL (ref 100–199)
HDL: 54 mg/dL (ref >39)
LDL Chol Calc (NIH): 73 mg/dL (ref 0–99)
Triglycerides: 199 mg/dL — ABNORMAL HIGH (ref 0–149)
VLDL Cholesterol Cal: 33 mg/dL (ref 5–40)

## 2024-07-02 LAB — CMP14+EGFR
ALT: 12 IU/L (ref 0–44)
AST: 16 IU/L (ref 0–40)
Albumin: 4.6 g/dL (ref 3.8–4.8)
Alkaline Phosphatase: 50 IU/L (ref 44–121)
BUN/Creatinine Ratio: 15 (ref 10–24)
BUN: 15 mg/dL (ref 8–27)
Bilirubin Total: 0.5 mg/dL (ref 0.0–1.2)
CO2: 22 mmol/L (ref 20–29)
Calcium: 9.4 mg/dL (ref 8.6–10.2)
Chloride: 100 mmol/L (ref 96–106)
Creatinine, Ser: 1 mg/dL (ref 0.76–1.27)
Globulin, Total: 2.7 g/dL (ref 1.5–4.5)
Glucose: 106 mg/dL — ABNORMAL HIGH (ref 70–99)
Potassium: 4.3 mmol/L (ref 3.5–5.2)
Sodium: 139 mmol/L (ref 134–144)
Total Protein: 7.3 g/dL (ref 6.0–8.5)
eGFR: 80 mL/min/1.73 (ref >59)

## 2024-07-02 LAB — HEMOGLOBIN A1C
Est. average glucose Bld gHb Est-mCnc: 131 mg/dL
Hgb A1c MFr Bld: 6.2 % — ABNORMAL HIGH (ref 4.8–5.6)

## 2024-07-02 LAB — VITAMIN D 25 HYDROXY (VIT D DEFICIENCY, FRACTURES): Vit D, 25-Hydroxy: 62.8 ng/mL (ref 30.0–100.0)

## 2024-07-03 NOTE — Assessment & Plan Note (Signed)
 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 No current respiratory symptoms reported. - Refill DuoNeb prescription

## 2024-07-03 NOTE — Assessment & Plan Note (Signed)
 Lumbar radiculopathy  Chronic back pain with radiation to the knees, exacerbated at night, with a history of lumbar disc herniation and left-sided surgery over ten years ago. Differential includes herniated disc and spinal stenosis. Chiropractic adjustments provide temporary relief. - Submit referral to neurosurgery for evaluation - Review previous surgical records and imaging - Consider MRI to assess current spinal status

## 2024-07-03 NOTE — Assessment & Plan Note (Signed)
 Type 2 Diabetes  Chronic & well controlled A1c of 6.2 and blood glucose levels between 101 and 129 mg/dL, managed with metformin  1000 mg twice daily. Lifestyle modifications discussed to improve glycemic control. - Order complete metabolic panel and A1c - Continue metformin  1000 mg twice daily - Encourage dietary modifications to stabilize blood glucose levels

## 2024-07-03 NOTE — Assessment & Plan Note (Signed)
 Mixed hyperlipidemia Chronic Mixed hyperlipidemia with total cholesterol of 189 mg/dL, triglycerides at 764 mg/dL, HDL at 57 mg/dL, and LDL at 93 mg/dL. Currently on atorvastatin  10 mg daily. Previous discontinuation of fenofibrate  due to dietary management of triglycerides. Discussed dietary influences on triglyceride levels and potential benefits of fenofibrate . - Order lipid panel - Continue atorvastatin  10 mg daily - Reinitiate fenofibrate  40 mg daily - Advise dietary modifications to reduce triglycerides

## 2024-07-03 NOTE — Assessment & Plan Note (Signed)
 Onychogryphosis of toenails Onychogryphosis causing difficulty with nail care. - Submit referral to podiatry for nail care and bunion evaluation

## 2024-07-03 NOTE — Assessment & Plan Note (Signed)
 Hypertension Chronic, at goal Hypertension managed with losartan  25 mg twice daily. - Continue losartan  25 mg twice daily

## 2024-07-04 ENCOUNTER — Other Ambulatory Visit: Payer: Self-pay | Admitting: Family Medicine

## 2024-07-04 DIAGNOSIS — E781 Pure hyperglyceridemia: Secondary | ICD-10-CM

## 2024-07-05 ENCOUNTER — Other Ambulatory Visit: Payer: Self-pay | Admitting: Family Medicine

## 2024-07-05 DIAGNOSIS — E781 Pure hyperglyceridemia: Secondary | ICD-10-CM

## 2024-07-05 NOTE — Telephone Encounter (Signed)
Was sent in yesterday by provider

## 2024-07-06 ENCOUNTER — Ambulatory Visit: Payer: Self-pay | Admitting: Family Medicine

## 2024-07-18 ENCOUNTER — Ambulatory Visit: Admitting: Podiatry

## 2024-07-20 ENCOUNTER — Other Ambulatory Visit: Payer: Self-pay | Admitting: Family Medicine

## 2024-07-20 ENCOUNTER — Inpatient Hospital Stay
Admission: RE | Admit: 2024-07-20 | Discharge: 2024-07-20 | Disposition: A | Discharge status: Home / Self Care | Payer: Self-pay | Source: Ambulatory Visit | Attending: Physician Assistant | Admitting: Physician Assistant

## 2024-07-20 DIAGNOSIS — Z049 Encounter for examination and observation for unspecified reason: Secondary | ICD-10-CM

## 2024-07-21 ENCOUNTER — Inpatient Hospital Stay
Admission: RE | Admit: 2024-07-21 | Discharge: 2024-07-21 | Disposition: A | Discharge status: Home / Self Care | Payer: Self-pay | Source: Ambulatory Visit | Attending: Physician Assistant | Admitting: Physician Assistant

## 2024-07-21 ENCOUNTER — Other Ambulatory Visit: Payer: Self-pay | Admitting: Family Medicine

## 2024-07-21 DIAGNOSIS — Z049 Encounter for examination and observation for unspecified reason: Secondary | ICD-10-CM

## 2024-07-25 NOTE — Progress Notes (Unsigned)
 Referring Physician:  Sharma Coyer, MD [address] [place],  [place] [address]  Primary Physician:  Sharma Coyer, MD  History of Present Illness: 07/25/2024 [name] is here today with a chief complaint of ***  Low back pain Left leg pain  Duration: *** Location: *** Quality: *** Severity: ***  Precipitating: aggravated by *** Modifying factors: made better by *** Weakness: none Timing: *** Bowel/Bladder Dysfunction: none  Conservative measures:  Physical therapy: *** has not participated in? Multimodal medical therapy including regular antiinflammatories: *** tylenol , ibuprofen  Injections: *** no epidural steroid injections?  Past Surgery: *** Back Surgery in ***   Yuvaan Sanderfer has ***no symptoms of cervical myelopathy.  The symptoms are causing a significant impact on the patient's life.   Review of Systems:  A 10 point review of systems is negative, except for the pertinent positives and negatives detailed in the HPI.  Past Medical History: Past Medical History:  Diagnosis Date   CKD (chronic kidney disease) stage 2, GFR 60-89 ml/min    COVID-19 2020   severe while in [place] -since then has had some sob and is seeing dr parris   DM (diabetes mellitus), type 2 (HCC)    Hypertension    Incarcerated umbilical hernia    Reactive airway disease     Past Surgical History: Past Surgical History:  Procedure Laterality Date   BACK SURGERY Left    COLONOSCOPY WITH PROPOFOL  N/A 10/09/2015   Procedure: COLONOSCOPY WITH PROPOFOL ;  Surgeon: Rogelia Copping, MD;  Location: [facility] ENDOSCOPY;  Service: Endoscopy;  Laterality: N/A;   COLONOSCOPY WITH PROPOFOL  N/A 11/18/2023   Procedure: COLONOSCOPY WITH PROPOFOL ;  Surgeon: Therisa Bi, MD;  Location: [facility] ENDOSCOPY;  Service: Gastroenterology;  Laterality: N/A;   INGUINAL HERNIA REPAIR Left    POLYPECTOMY  11/18/2023   Procedure: POLYPECTOMY;  Surgeon: Therisa Bi, MD;  Location: [facility] ENDOSCOPY;  Service: Gastroenterology;;    Allergies: Allergies as of 07/27/2024   (No Known Allergies)    Medications: Outpatient Encounter Medications as of 07/27/2024  Medication Sig   acetaminophen  (TYLENOL ) 500 MG tablet Take 2 tablets (1,000 mg total) by mouth every 6 (six) hours as needed for mild pain.   aspirin EC 81 MG tablet 81 mg daily.   atorvastatin  (LIPITOR) 10 MG tablet Take 1 tablet (10 mg total) by mouth daily.   cetirizine (ZYRTEC) 10 MG tablet Take by mouth. (Patient not taking: Reported on 11/18/2023)   Cholecalciferol (VITAMIN D3) 1.25 MG (50000 UT) CAPS Take 1 capsule by mouth once a week.   CINNAMON PO Take by mouth.   Fenofibrate  40 MG TABS TAKE 1 TABLET BY MOUTH EVERY DAY IN THE MORNING   ibuprofen  (ADVIL ) 600 MG tablet Take 1 tablet (600 mg total) by mouth every 8 (eight) hours as needed for moderate pain. (Patient not taking: Reported on 11/18/2023)   ipratropium-albuterol  (DUONEB) 0.5-2.5 (3) MG/3ML SOLN Take 3 mLs by nebulization 2 (two) times daily.   JARDIANCE 10 MG TABS tablet Take 10 mg by mouth every morning.   losartan  (COZAAR ) 25 MG tablet Take 1 tablet (25 mg total) by mouth 2 (two) times daily.   metFORMIN  (GLUCOPHAGE ) 1000 MG tablet Take 1 tablet (1,000 mg total) by mouth 2 (two) times daily.   Misc Natural Products (TURMERIC, CURCUMIN, PO) Take by mouth.   No facility-administered encounter medications on file as of 07/27/2024.    Social History: Social History   Tobacco Use   Smoking status: Former  Types: Cigarettes   Smokeless tobacco: Never  Vaping Use   Vaping status: Never Used  Substance Use Topics   Alcohol use: Yes    Comment: 2-3 beers nightly   Drug use: No    Family Medical History: No family history on file.  Physical Examination: @VITALWITHPAIN @  General: Patient is well developed, well nourished, calm, collected, and in no apparent distress. Attention to examination is  appropriate.  Psychiatric: Patient is non-anxious.  Head:  Pupils equal, round, and reactive to light.  ENT:  Oral mucosa appears well hydrated.  Neck:   Supple.  ***Full range of motion.  Respiratory: Patient is breathing without any difficulty.  Extremities: No edema.  Vascular: Palpable dorsal pedal pulses.  Skin:   On exposed skin, there are no abnormal skin lesions.  NEUROLOGICAL:     Awake, alert, oriented to person, place, and time.  Speech is clear and fluent. Fund of knowledge is appropriate.   Cranial Nerves: Pupils equal round and reactive to light.  Facial tone is symmetric.  Facial sensation is symmetric.  ROM of spine: ***full.  Palpation of spine: ***non tender.    Strength: Side Biceps Triceps Deltoid Interossei Grip Wrist Ext. Wrist Flex.  R 5 5 5 5 5 5 5   L 5 5 5 5 5 5 5    Side Iliopsoas Quads Hamstring PF DF EHL  R 5 5 5 5 5 5   L 5 5 5 5 5 5    Reflexes are ***2+ and symmetric at the biceps, triceps, brachioradialis, patella and achilles.   Hoffman's is absent.  Clonus is not present.  Toes are down-going.  Bilateral upper and lower extremity sensation is intact to light touch.    Gait is normal.   No difficulty with tandem gait.   No evidence of dysmetria noted.  Medical Decision Making  Imaging: ***  I have personally reviewed the images and agree with the above interpretation.  Assessment and Plan: [name] is a pleasant 73 y.o. male with ***    Thank you for involving me in the care of this patient.   I spent a total of *** minutes in both face-to-face and non-face-to-face activities for this visit on the date of this encounter.   Lyle Decamp, PA-C Dept. of Neurosurgery

## 2024-07-27 ENCOUNTER — Encounter: Payer: Self-pay | Admitting: Physician Assistant

## 2024-07-27 ENCOUNTER — Ambulatory Visit: Payer: Self-pay | Admitting: Physician Assistant

## 2024-07-27 VITALS — BP 120/78 | Ht 68.0 in | Wt 166.6 lb

## 2024-07-27 DIAGNOSIS — R202 Paresthesia of skin: Secondary | ICD-10-CM

## 2024-07-27 DIAGNOSIS — R200 Anesthesia of skin: Secondary | ICD-10-CM

## 2024-07-27 DIAGNOSIS — M545 Low back pain, unspecified: Secondary | ICD-10-CM | POA: Diagnosis not present

## 2024-07-27 MED ORDER — GABAPENTIN 300 MG PO CAPS: 300.0000 mg | ORAL_CAPSULE | Freq: Every day | ORAL | 1 refills | Status: DC

## 2024-07-28 ENCOUNTER — Other Ambulatory Visit: Payer: Self-pay

## 2024-07-28 ENCOUNTER — Encounter: Payer: Self-pay | Admitting: Neurology

## 2024-07-28 DIAGNOSIS — R202 Paresthesia of skin: Secondary | ICD-10-CM

## 2024-07-29 ENCOUNTER — Ambulatory Visit
Admission: RE | Admit: 2024-07-29 | Discharge: 2024-07-29 | Disposition: A | Discharge status: Home / Self Care | Attending: Physician Assistant | Admitting: Physician Assistant

## 2024-07-29 ENCOUNTER — Ambulatory Visit
Admission: RE | Admit: 2024-07-29 | Discharge: 2024-07-29 | Disposition: A | Discharge status: Home / Self Care | Source: Ambulatory Visit | Attending: Physician Assistant | Admitting: Physician Assistant

## 2024-07-29 DIAGNOSIS — M545 Low back pain, unspecified: Secondary | ICD-10-CM | POA: Insufficient documentation

## 2024-08-01 ENCOUNTER — Ambulatory Visit (INDEPENDENT_AMBULATORY_CARE_PROVIDER_SITE_OTHER): Payer: Self-pay | Admitting: Podiatrist

## 2024-08-01 DIAGNOSIS — B351 Tinea unguium: Secondary | ICD-10-CM

## 2024-08-01 DIAGNOSIS — M79674 Pain in right toe(s): Secondary | ICD-10-CM

## 2024-08-01 DIAGNOSIS — M79675 Pain in left toe(s): Secondary | ICD-10-CM | POA: Diagnosis not present

## 2024-08-01 NOTE — Progress Notes (Unsigned)
 Chief Complaint  Patient presents with   Nail Problem    NP  thick painful toenails   Diabetes    Diabetic foot care     HPI: Patient is 73 y.o. male who presents today for concerns as listed above.  History confirmed with patient   Patient Active Problem List   Diagnosis Date Noted   Lumbar back pain with radiculopathy affecting left lower extremity 07/01/2024   Thickened nails 07/01/2024   Hypertriglyceridemia 07/01/2024   Long COVID 07/01/2024   Hypertension associated with diabetes (HCC) 07/01/2024   Vitamin D  deficiency 07/01/2024   Hyperlipidemia associated with type 2 diabetes mellitus (HCC) 07/01/2024   Type 2 diabetes mellitus without complication, without long-term current use of insulin (HCC) 07/01/2024   Lumbar disc herniation 07/01/2024   Adenomatous polyp of colon 11/18/2023   Incarcerated umbilical hernia 05/05/2023   Benign neoplasm of sigmoid colon     Current Outpatient Medications on File Prior to Visit  Medication Sig Dispense Refill   acetaminophen  (TYLENOL ) 500 MG tablet Take 2 tablets (1,000 mg total) by mouth every 6 (six) hours as needed for mild pain.     aspirin EC 81 MG tablet 81 mg daily.     atorvastatin  (LIPITOR) 10 MG tablet Take 1 tablet (10 mg total) by mouth daily. 90 tablet 3   Cholecalciferol (VITAMIN D3) 1.25 MG (50000 UT) CAPS Take 1 capsule by mouth once a week.     CINNAMON PO Take by mouth.     ergocalciferol  (VITAMIN D2) 1.25 MG (50000 UT) capsule Take by mouth.     Fenofibrate  40 MG TABS TAKE 1 TABLET BY MOUTH EVERY DAY IN THE MORNING 90 tablet 3   gabapentin  (NEURONTIN ) 300 MG capsule Take 1 capsule (300 mg total) by mouth at bedtime. 30 capsule 1   ipratropium-albuterol  (DUONEB) 0.5-2.5 (3) MG/3ML SOLN Take 3 mLs by nebulization 2 (two) times daily. 360 mL 2   JARDIANCE 10 MG TABS tablet Take 10 mg by mouth every morning.     losartan  (COZAAR ) 25 MG tablet Take 1 tablet (25 mg total) by mouth 2 (two) times daily. 90 tablet 2    metFORMIN  (GLUCOPHAGE ) 1000 MG tablet Take 1 tablet (1,000 mg total) by mouth 2 (two) times daily. 180 tablet 2   Misc Natural Products (TURMERIC, CURCUMIN, PO) Take by mouth.     TURMERIC PO Take by mouth.     No current facility-administered medications on file prior to visit.    No Known Allergies  Review of Systems No fevers, chills, nausea, muscle aches, no difficulty breathing, no calf pain, no chest pain or shortness of breath.   Physical Exam  GENERAL APPEARANCE: Alert, conversant. Appropriately groomed. No acute distress.   VASCULAR: Pedal pulses palpable ***/4 DP and PT bilateral.  Capillary refill time is immediate to all digits,  Proximal to distal cooling is warm to warm.  Digital perfusion adequate.   NEUROLOGIC: sensation is intact to 5.07 monofilament at 5/5 sites bilateral.  Light touch is intact bilateral, vibratory sensation intact bilateral  MUSCULOSKELETAL: acceptable muscle strength, tone and stability bilateral.  No gross boney pedal deformities noted.  No pain, crepitus or limitation noted with foot and ankle range of motion bilateral.   DERMATOLOGIC: skin is warm, supple, and dry.  Color, texture, and turgor of skin within normal limits.  No open wounds are noted.  No preulcerative lesions are seen.  Digital nails are asymptomatic.    Left nails 3,4,5 ar ok.  Left first was split   Assessment   ***  Plan  ***

## 2024-08-09 ENCOUNTER — Ambulatory Visit
Admission: RE | Admit: 2024-08-09 | Discharge: 2024-08-09 | Disposition: A | Discharge status: Home / Self Care | Source: Ambulatory Visit | Attending: Physician Assistant | Admitting: Physician Assistant

## 2024-08-09 DIAGNOSIS — M545 Low back pain, unspecified: Secondary | ICD-10-CM

## 2024-08-12 ENCOUNTER — Encounter: Admitting: Neurology

## 2024-08-15 ENCOUNTER — Ambulatory Visit (INDEPENDENT_AMBULATORY_CARE_PROVIDER_SITE_OTHER): Admitting: Physician Assistant

## 2024-08-15 ENCOUNTER — Other Ambulatory Visit: Payer: Self-pay | Admitting: Physician Assistant

## 2024-08-15 DIAGNOSIS — M48062 Spinal stenosis, lumbar region with neurogenic claudication: Secondary | ICD-10-CM

## 2024-08-15 DIAGNOSIS — R200 Anesthesia of skin: Secondary | ICD-10-CM

## 2024-08-15 DIAGNOSIS — M48061 Spinal stenosis, lumbar region without neurogenic claudication: Secondary | ICD-10-CM

## 2024-08-15 MED ORDER — GABAPENTIN 300 MG PO CAPS: 300.0000 mg | ORAL_CAPSULE | Freq: Every day | ORAL | 1 refills | Status: DC

## 2024-08-15 NOTE — Progress Notes (Signed)
 MRI Lumbar spine 08/11/24:   EXAM: MRI LUMBAR SPINE WITHOUT CONTRAST   TECHNIQUE: Multiplanar, multisequence MR imaging of the lumbar spine was performed. No intravenous contrast was administered.   COMPARISON:  Multiple exams, including Radiographs 07/29/2024   FINDINGS: Segmentation: Correlation with radiography indicates that there are 12 rib-bearing vertebra with the T12 ribs small in size; and 4 segmental lumbar type non-rib-bearing vertebra. Accordingly, numbering this case assumes that the lowest segmental lumbar type non-rib-bearing vertebra is L4. Careful correlation with this numbering strategy prior to any procedural intervention would be recommended.   Alignment:  3 mm degenerative retrolisthesis at L4-S1.   Vertebrae:  Disc desiccation at all lumbar levels between L2 and S1.   Type 2 degenerative endplate findings at L4-S1.   Conus medullaris and cauda equina: Conus extends to the lower L1 level. Conus and cauda equina appear normal.   Paraspinal and other soft tissues: Unremarkable   Disc levels:   T12-L1: No impingement.  Disc bulge.   L1-2: Unremarkable   L2-3: No impingement.  Mild disc bulge.   L3-4: Moderate central stenosis due to disc bulge.   L3-4: Moderate right foraminal and lateral extraforaminal impingement due to right foraminal and lateral extraforaminal disc protrusion, intervertebral spurring, and facet arthropathy. There is substantial displacement of the right L3 nerve in the lateral extraforaminal space. Small synovial cyst posterior to the facet joint.   L4-S1: Moderate to severe left subarticular lateral recess stenosis with moderate left foraminal stenosis due to left paracentral and lateral recess disc protrusion, disc bulge, intervertebral spurring, and facet arthropathy.   IMPRESSION: 1. The lowest segmental lumbar type non-rib-bearing vertebra is L4 (no L5 level). Careful correlation with this numbering strategy prior to  any procedural intervention would be recommended. 2. Lumbar spondylosis and degenerative disc disease, causing moderate to prominent impingement at L4-S1 and moderate impingement at L3-4.   Spoke with patient regarding MRI results.  He does have significant stenosis ranging from L4-S1.  He still remains without weakness, but does have significant pain.  Unfortunately had some difficulty with scheduling physical therapy.  We went over how this is imperative in the process and he plans to call today to set this up.  He also has an EMG scheduled in 2 weeks which I will review once complete.  Gabapentin  is helping, but does cause some drowsiness for him.  He has asked for refill which I am happy to give to him.  He is interested in injections and I will place a referral for this as well.  We will keep established follow-up appointment.     This visit was performed via telephone.   Patient location: home Provider location: office   I spent a total of 10 minutes non-face-to-face activities for this visit on the date of this encounter including review of current clinical condition and response to treatment.   The patient is aware of and accepts the limits of this telehealth visit.

## 2024-08-25 ENCOUNTER — Telehealth: Payer: Self-pay | Admitting: Physician Assistant

## 2024-08-25 ENCOUNTER — Other Ambulatory Visit: Payer: Self-pay | Admitting: Physician Assistant

## 2024-08-25 DIAGNOSIS — M48062 Spinal stenosis, lumbar region with neurogenic claudication: Secondary | ICD-10-CM

## 2024-08-25 NOTE — Telephone Encounter (Signed)
 Patient advised.

## 2024-08-25 NOTE — Telephone Encounter (Signed)
 Patient is calling back to follow up on the referral that was going to be placed for him to have injections.

## 2024-09-01 ENCOUNTER — Ambulatory Visit (INDEPENDENT_AMBULATORY_CARE_PROVIDER_SITE_OTHER): Admitting: Neurology

## 2024-09-01 DIAGNOSIS — M5416 Radiculopathy, lumbar region: Secondary | ICD-10-CM

## 2024-09-01 DIAGNOSIS — R202 Paresthesia of skin: Secondary | ICD-10-CM | POA: Diagnosis not present

## 2024-09-01 NOTE — Procedures (Signed)
  [facility]  [address] [place], [address]  [place], [place] [address] Tel: [phone] Fax: [phone] Test Date:  09/01/2024  Patient: Jason Hammond DOB: [birth year] Physician: Tonita Blanch, DO  Sex: Male Height: 5' 8 Ref Phys: Lyle Ulis RIGGERS  ID#: [id number]   Technician:    History: This is a 73 year old man referred for evaluation of left lower extremity paresthesias and pain.  NCV & EMG Findings: Extensive electrodiagnostic testing of the left lower extremity shows:  Left sural and superficial peroneal sensory responses are within normal limits. Left peroneal motor response is mildly reduced at the extensor digitorum brevis, and normal at the tibialis anterior.  Left tibial motor responses within normal limits. Absent left tibial H reflex is of uncertain clinical significance. Chronic motor axon loss changes are seen affecting the left L4 myotome, without accompanying active denervation.    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 There is no evidence of a peroneal mononeuropathy or sensorimotor polyneuropathy affecting the left lower extremity.   ___________________________ Tonita Blanch, DO    Nerve Conduction Studies   Stim Site NR Peak (ms) Norm Peak (ms) O-P Amp (V) Norm O-P Amp  Left Sup Peroneal Anti Sensory (Ant Lat Mall)  32 C  12 cm    2.2 <4.6 6.1 >3  Left Sural Anti Sensory (Lat Mall)  32 C  Calf    2.9 <4.6 12.8 >3     Stim Site NR Onset (ms) Norm Onset (ms) O-P Amp (mV) Norm O-P Amp Site1 Site2 Delta-0 (ms) Dist (cm) Vel (m/s) Norm Vel (m/s)  Left Peroneal Motor (Ext Dig Brev)  32 C  Ankle    3.5 <6.0 *2.1 >2.5 B Fib Ankle 8.8 36.0 41 >40  B Fib    12.3  2.0  Poplt B Fib 1.5 7.0 47 >40  Poplt    13.8  1.9         Left Peroneal TA Motor (Tib Ant)  32 C  Fib Head    2.3 <4.5 5.1 >3 Poplit Fib Head 1.7 8.0 47 >40  Poplit    4.0 <5.7 5.1         Left Tibial Motor (Abd Hall Brev)  32 C   Ankle    3.5 <6.0 13.3 >4 Knee Ankle 9.2 38.0 41 >40  Knee    12.7  11.1          Electromyography   Side Muscle Ins.Act Fibs Fasc Recrt Amp Dur Poly Activation Comment  Left AntTibialis Nml Nml Nml *1- *1+ *1+ *1+ Nml N/A  Left Gastroc Nml Nml Nml Nml Nml Nml Nml Nml N/A  Left Flex Dig Long Nml Nml Nml Nml Nml Nml Nml Nml N/A  Left BicepsFemS Nml Nml Nml Nml Nml Nml Nml Nml N/A  Left RectFemoris Nml Nml Nml *1- *1+ *1+ *1+ Nml N/A  Left GluteusMed Nml Nml Nml Nml Nml Nml Nml Nml N/A      Waveforms:

## 2024-09-26 ENCOUNTER — Encounter: Payer: Self-pay | Admitting: Physician Assistant

## 2024-09-26 ENCOUNTER — Ambulatory Visit: Admitting: Physician Assistant

## 2024-09-26 VITALS — BP 122/60 | Ht 68.0 in | Wt 175.1 lb

## 2024-09-26 DIAGNOSIS — G8929 Other chronic pain: Secondary | ICD-10-CM

## 2024-09-26 DIAGNOSIS — M47816 Spondylosis without myelopathy or radiculopathy, lumbar region: Secondary | ICD-10-CM | POA: Diagnosis not present

## 2024-09-26 DIAGNOSIS — M5136 Other intervertebral disc degeneration, lumbar region with discogenic back pain only: Secondary | ICD-10-CM

## 2024-09-26 DIAGNOSIS — M48062 Spinal stenosis, lumbar region with neurogenic claudication: Secondary | ICD-10-CM | POA: Diagnosis not present

## 2024-09-26 MED ORDER — TIZANIDINE HCL 4 MG PO TABS: 4.0000 mg | ORAL_TABLET | Freq: Three times a day (TID) | ORAL | 1 refills | Status: DC | PRN

## 2024-09-26 MED ORDER — GABAPENTIN 300 MG PO CAPS: 300.0000 mg | ORAL_CAPSULE | Freq: Every day | ORAL | 3 refills | Status: AC

## 2024-09-26 NOTE — Progress Notes (Signed)
 Referring Physician:  Sharma Coyer, MD [address] [place],  [place] [address]  Primary Physician:  Sharma Coyer, MD  History of Present Illness: 09/26/2024 [name] Is here today for follow-up on chronic back pain.  He has been ongoing physical therapy in which he has noticed improvement.  He has also had a lumbar injection which helped his pain.  He notices that he is very stiff in the morning and hot showers have been helping some.  He notices that when gabapentin  does not help his pain, but when he takes to he notices a difference.  He denies any falls, new weakness, numbness or tingling.    Weakness: none Timing: Over 6 months Bowel/Bladder Dysfunction: none  Conservative measures:  Physical therapy: Has not participated in. Multimodal medical therapy including regular antiinflammatories: tylenol , ibuprofen , chiropractor Injections: no epidural steroid injections.    [name] has no symptoms of cervical myelopathy.  The symptoms are causing a significant impact on the patient's life.   Review of Systems:  A 10 point review of systems is negative, except for the pertinent positives and negatives detailed in the HPI.  Past Medical History: Past Medical History:  Diagnosis Date   CKD (chronic kidney disease) stage 2, GFR 60-89 ml/min    COVID-19 2020   severe while in [place] -since then has had some sob and is seeing dr parris   DM (diabetes mellitus), type 2 (HCC)    Hypertension    Incarcerated umbilical hernia    Reactive airway disease     Past Surgical History: Past Surgical History:  Procedure Laterality Date   BACK SURGERY Left    COLONOSCOPY WITH PROPOFOL  N/A 10/09/2015   Procedure: COLONOSCOPY WITH PROPOFOL ;  Surgeon: Rogelia Copping, MD;  Location: [facility] ENDOSCOPY;  Service: Endoscopy;  Laterality: N/A;   COLONOSCOPY WITH PROPOFOL  N/A 11/18/2023   Procedure: COLONOSCOPY WITH PROPOFOL ;   Surgeon: Therisa Bi, MD;  Location: [facility] ENDOSCOPY;  Service: Gastroenterology;  Laterality: N/A;   INGUINAL HERNIA REPAIR Left    POLYPECTOMY  11/18/2023   Procedure: POLYPECTOMY;  Surgeon: Therisa Bi, MD;  Location: [facility] ENDOSCOPY;  Service: Gastroenterology;;    Allergies: Allergies as of 09/26/2024   (No Known Allergies)    Medications: Outpatient Encounter Medications as of 09/26/2024  Medication Sig   aspirin EC 81 MG tablet 81 mg daily.   atorvastatin  (LIPITOR) 10 MG tablet Take 1 tablet (10 mg total) by mouth daily.   CINNAMON PO Take by mouth.   Fenofibrate  40 MG TABS TAKE 1 TABLET BY MOUTH EVERY DAY IN THE MORNING   gabapentin  (NEURONTIN ) 300 MG capsule Take 1 capsule (300 mg total) by mouth at bedtime.   JARDIANCE 10 MG TABS tablet Take 10 mg by mouth every morning.   losartan  (COZAAR ) 25 MG tablet Take 1 tablet (25 mg total) by mouth 2 (two) times daily.   metFORMIN  (GLUCOPHAGE ) 1000 MG tablet Take 1 tablet (1,000 mg total) by mouth 2 (two) times daily.   TURMERIC PO Take by mouth.   VITAMIN D , CHOLECALCIFEROL, PO Take by mouth daily.   [DISCONTINUED] acetaminophen  (TYLENOL ) 500 MG tablet Take 2 tablets (1,000 mg total) by mouth every 6 (six) hours as needed for mild pain.   [DISCONTINUED] Cholecalciferol (VITAMIN D3) 1.25 MG (50000 UT) CAPS Take 1 capsule by mouth once a week.   [DISCONTINUED] ergocalciferol  (VITAMIN D2) 1.25 MG (50000 UT) capsule Take by mouth.   [DISCONTINUED] ipratropium-albuterol  (DUONEB) 0.5-2.5 (3) MG/3ML SOLN Take 3  mLs by nebulization 2 (two) times daily.   [DISCONTINUED] Misc Natural Products (TURMERIC, CURCUMIN, PO) Take by mouth.   No facility-administered encounter medications on file as of 09/26/2024.    Social History: Social History   Tobacco Use   Smoking status: Former    Types: Cigarettes   Smokeless tobacco: Never  Vaping Use   Vaping status: Never Used  Substance Use Topics   Alcohol use: Yes    Comment: 2-3 beers nightly    Drug use: No    Family Medical History: No family history on file.  Physical Examination: @VITALWITHPAIN @  General: Patient is well developed, well nourished, calm, collected, and in no apparent distress. Attention to examination is appropriate.  Psychiatric: Patient is non-anxious.  Head:  Pupils equal, round, and reactive to light.  ENT:  Oral mucosa appears well hydrated.  Neck:   Supple.  Full range of motion.  Respiratory: Patient is breathing without any difficulty.  Extremities: No edema.  Vascular: Palpable dorsal pedal pulses.  Skin:   On exposed skin, there are no abnormal skin lesions.  NEUROLOGICAL:     Awake, alert, oriented to person, place, and time.  Speech is clear and fluent. Fund of knowledge is appropriate.   Cranial Nerves: Pupils equal round and reactive to light.  Facial tone is symmetric.    ROM of spine: Some tenderness palpation of lumbar paraspinals.    2+ patella and hamstring reflexes, 1+ achilles reflex on the left, absent on the right   Strength:  Side Iliopsoas Quads Hamstring PF DF EHL  R 5 5 5 5 5 5   L 5 5 5  4+ 5 4+     Clonus is not present.  Toes are down-going.  Some decrease in bilateral lower extremity sensation below the knee. Gait is normal.   No difficulty with tandem gait.   No evidence of dysmetria noted.  Medical Decision Making  Imaging: EXAM: MRI LUMBAR SPINE WITHOUT CONTRAST   TECHNIQUE: Multiplanar, multisequence MR imaging of the lumbar spine was performed. No intravenous contrast was administered.   COMPARISON:  Multiple exams, including Radiographs 07/29/2024   FINDINGS: Segmentation: Correlation with radiography indicates that there are 12 rib-bearing vertebra with the T12 ribs small in size; and 4 segmental lumbar type non-rib-bearing vertebra. Accordingly, numbering this case assumes that the lowest segmental lumbar type non-rib-bearing vertebra is L4. Careful correlation with  this numbering strategy prior to any procedural intervention would be recommended.   Alignment:  3 mm degenerative retrolisthesis at L4-S1.   Vertebrae:  Disc desiccation at all lumbar levels between L2 and S1.   Type 2 degenerative endplate findings at L4-S1.   Conus medullaris and cauda equina: Conus extends to the lower L1 level. Conus and cauda equina appear normal.   Paraspinal and other soft tissues: Unremarkable   Disc levels:   T12-L1: No impingement.  Disc bulge.   L1-2: Unremarkable   L2-3: No impingement.  Mild disc bulge.   L3-4: Moderate central stenosis due to disc bulge.   L3-4: Moderate right foraminal and lateral extraforaminal impingement due to right foraminal and lateral extraforaminal disc protrusion, intervertebral spurring, and facet arthropathy. There is substantial displacement of the right L3 nerve in the lateral extraforaminal space. Small synovial cyst posterior to the facet joint.   L4-S1: Moderate to severe left subarticular lateral recess stenosis with moderate left foraminal stenosis due to left paracentral and lateral recess disc protrusion, disc bulge, intervertebral spurring, and facet arthropathy.   IMPRESSION: 1. The  lowest segmental lumbar type non-rib-bearing vertebra is L4 (no L5 level). Careful correlation with this numbering strategy prior to any procedural intervention would be recommended. 2. Lumbar spondylosis and degenerative disc disease, causing moderate to prominent impingement at L4-S1 and moderate impingement at L3-4.    EXAM: LUMBAR SPINE - COMPLETE 4+ VIEW   COMPARISON:  None Available.   FINDINGS: Five lumbar type vertebral bodies are well visualized. Vertebral body height is well maintained. Mild osteophytic changes are seen. No anterolisthesis is noted. Disc space narrowing at L4-5 is seen. Flexion and extension views show no significant instability. Small nonobstructing right renal stone is noted measuring  approximately 2 mm.   IMPRESSION: Degenerative change of the lumbar spine without instability on flexion and extension.   Right renal stone as described.      I have personally reviewed the images and agree with the above interpretation.  Assessment and Plan: Mr. Noboa is a pleasant 73 y.o. male Is here today for follow-up on chronic back pain and known lumbar spondylosis and degenerative disc disease with moderate stenosis extending from L3-S1.  He has been ongoing physical therapy in which he has noticed improvement.  He has also had a lumbar injection which helped his pain.  He notices that he is very stiff in the morning and hot showers have been helping some.  He notices that when gabapentin  does not help his pain, but when he takes to he notices a difference.  He denies any falls, new weakness, numbness or tingling.  I am so pleased that he has had improvement.  I have encouraged the patient to continue home exercises that he has learned with physical therapy and follow-up with the pain team if he feels as though he is needing an another injection.  Plan to increase gabapentin  at night since that was helping more with his pain.  In addition patient is complaining of quite a bit of muscle tightness and he would like to trial a muscle relaxer.  Tizanidine was prescribed.  The risks and benefits of this medication were discussed at length.  He plans to let me know how he does with this medication.  I advised him to follow-up with us  on an as-needed basis.  Thank you for involving me in the care of this patient.      Lyle Decamp, PA-C Dept. of Neurosurgery

## 2024-09-27 ENCOUNTER — Ambulatory Visit: Admitting: Family Medicine

## 2024-10-03 ENCOUNTER — Ambulatory Visit: Admitting: Family Medicine

## 2024-10-07 ENCOUNTER — Encounter: Admitting: Neurology

## 2024-10-18 ENCOUNTER — Other Ambulatory Visit: Payer: Self-pay | Admitting: Physician Assistant

## 2024-10-18 NOTE — Telephone Encounter (Signed)
 Request for 90 day supply from pharmacy for next fill

## 2024-11-04 ENCOUNTER — Ambulatory Visit: Admitting: Family Medicine

## 2024-11-04 ENCOUNTER — Ambulatory Visit: Admitting: Podiatry

## 2024-11-04 ENCOUNTER — Encounter: Payer: Self-pay | Admitting: Podiatry

## 2024-11-04 ENCOUNTER — Encounter: Payer: Self-pay | Admitting: Family Medicine

## 2024-11-04 VITALS — BP 132/72 | HR 82 | Ht 68.0 in | Wt 174.2 lb

## 2024-11-04 DIAGNOSIS — E1169 Type 2 diabetes mellitus with other specified complication: Secondary | ICD-10-CM

## 2024-11-04 DIAGNOSIS — M79674 Pain in right toe(s): Secondary | ICD-10-CM

## 2024-11-04 DIAGNOSIS — M79675 Pain in left toe(s): Secondary | ICD-10-CM | POA: Diagnosis not present

## 2024-11-04 DIAGNOSIS — Z7984 Long term (current) use of oral hypoglycemic drugs: Secondary | ICD-10-CM

## 2024-11-04 DIAGNOSIS — E781 Pure hyperglyceridemia: Secondary | ICD-10-CM | POA: Diagnosis not present

## 2024-11-04 DIAGNOSIS — B351 Tinea unguium: Secondary | ICD-10-CM

## 2024-11-04 DIAGNOSIS — E119 Type 2 diabetes mellitus without complications: Secondary | ICD-10-CM

## 2024-11-04 DIAGNOSIS — E785 Hyperlipidemia, unspecified: Secondary | ICD-10-CM

## 2024-11-04 DIAGNOSIS — E559 Vitamin D deficiency, unspecified: Secondary | ICD-10-CM

## 2024-11-04 DIAGNOSIS — I152 Hypertension secondary to endocrine disorders: Secondary | ICD-10-CM | POA: Diagnosis not present

## 2024-11-04 DIAGNOSIS — R682 Dry mouth, unspecified: Secondary | ICD-10-CM

## 2024-11-04 DIAGNOSIS — M48062 Spinal stenosis, lumbar region with neurogenic claudication: Secondary | ICD-10-CM | POA: Diagnosis not present

## 2024-11-04 DIAGNOSIS — Z23 Encounter for immunization: Secondary | ICD-10-CM | POA: Diagnosis not present

## 2024-11-04 DIAGNOSIS — E1159 Type 2 diabetes mellitus with other circulatory complications: Secondary | ICD-10-CM

## 2024-11-04 DIAGNOSIS — Z1159 Encounter for screening for other viral diseases: Secondary | ICD-10-CM

## 2024-11-04 NOTE — Assessment & Plan Note (Signed)
 Lumbar spinal stenosis with neurogenic claudication Chronic condition with improvement through physical therapy and gabapentin . Continues physical therapy and exercises. - Continue gabapentin  300-600 mg at bedtime - Continue physical therapy - Continue tizanidine  4 mg every 8 hours as needed for muscle spasms -f/u with neurosurgery

## 2024-11-04 NOTE — Progress Notes (Unsigned)
 "  Established Patient Office Visit  Patient ID: Jason Hammond, male    DOB: 1951-06-10  Age: 74 y.o. MRN: [id number] PCP: Sharma Coyer, MD  Chief Complaint  Patient presents with   Medical Management of Chronic Issues    Patient is present for follow up with PCP, patient is doing well overall. Doing well in PT and exercising more     Subjective:     HPI  Discussed the use of AI scribe software for clinical note transcription with the patient, who gave verbal consent to proceed.  History of Present Illness Jason Hammond is a 74 year old male with lumbar spinal stenosis and type 2 diabetes who presents for a chronic follow-up.  He has lumbar spinal stenosis with neurogenic claudication and is following with neurosurgery. He is on gabapentin  300 to 600 mg at bedtime and undergoing physical therapy, which has led to improvement. He uses tizanidine  4 mg every eight hours as needed for muscle spasms associated with the spinal stenosis.  He has type 2 diabetes, with an A1c of 6.2 four months ago. He continues on metformin  1000 mg twice daily and Jardiance 10 mg daily, maintaining a well-controlled A1c within the goal range. An updated A1c will be obtained today.  He has hypertriglyceridemia and hyperlipidemia, for which he continues atorvastatin  10 mg daily. He has been restarted on fenofibrate  40 mg daily to help with triglyceride levels.  He has a history of vitamin D  deficiency, with the last vitamin D  level being 62, which is well controlled. He continues aspirin 81 mg daily.  He has hypertension, with blood pressure well controlled at 132/72, and continues losartan  25 mg daily.  He is active, engaging in physical therapy, walking regularly with his dog, gardening, and using a stationary exercise machine at home.   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   atorvastatin  (LIPITOR) 10 MG tablet Take 1 tablet (10 mg total) by mouth daily.   CINNAMON PO Take by mouth.   Fenofibrate  40 MG TABS TAKE 1 TABLET BY MOUTH EVERY DAY IN THE MORNING   gabapentin  (NEURONTIN ) 300 MG capsule Take 1-2 capsules (300-600 mg total) by mouth at bedtime.   JARDIANCE 10 MG TABS tablet Take 10 mg by mouth every morning.   losartan  (COZAAR ) 25 MG tablet Take 1 tablet (25 mg total) by mouth 2 (two) times daily.   metFORMIN  (GLUCOPHAGE ) 1000 MG tablet Take 1 tablet (1,000 mg total) by mouth 2 (two) times daily.   tiZANidine  (ZANAFLEX ) 4 MG tablet TAKE 1 TABLET BY MOUTH EVERY 8 HOURS AS NEEDED   TURMERIC PO Take by mouth.   VITAMIN D , CHOLECALCIFEROL, PO Take by mouth daily.   No facility-administered encounter medications on file as of 11/04/2024.  ROS    Objective:     BP 132/72 (BP Location: Left Arm, Patient Position: Sitting, Cuff Size: Normal)   Pulse 82   Ht 5' 8 (1.727 m)   Wt 174 lb 3.2 oz (79 kg)   SpO2 98%   BMI 26.49 kg/m  BP Readings from Last 3 Encounters:  11/04/24 132/72  09/26/24 122/60  07/27/24 120/78   Wt Readings from Last 3 Encounters:  11/04/24 174 lb 3.2 oz (79 kg)  09/26/24 175 lb 2 oz (79.4 kg)  07/27/24 166 lb 9.6 oz (75.6 kg)       Physical Exam Vitals reviewed.  Constitutional:      General: He is not in acute distress.    Appearance: Normal appearance. He is not ill-appearing.  Cardiovascular:     Rate and Rhythm: Normal rate and regular rhythm.  Pulmonary:     Effort: Pulmonary effort is normal. No respiratory distress.     Breath sounds: No wheezing, rhonchi or rales.  Abdominal:     General: Bowel sounds are normal. There is no distension.     Palpations: Abdomen is soft.  Neurological:     Mental Status: He is alert and oriented to person, place, and time.  Psychiatric:        Mood and Affect: Mood normal.        Behavior: Behavior normal.     {PhysExam Abridge (Optional):[id number]} No results found for any visits on 11/04/24.  Last CBC Lab Results  Component Value Date   WBC 9.5 04/16/2023   HGB 14.7 04/16/2023   HCT 44.9 04/16/2023   MCV 96.4 04/16/2023   MCH 31.5 04/16/2023   RDW 12.1 04/16/2023   PLT 219 04/16/2023   Last metabolic panel Lab Results  Component Value Date   GLUCOSE 106 (H) 07/01/2024   NA 139 07/01/2024   K 4.3 07/01/2024   CL 100 07/01/2024   CO2 22 07/01/2024   BUN 15 07/01/2024   CREATININE 1.00 07/01/2024   EGFR 80 07/01/2024   CALCIUM  9.4 07/01/2024   PROT 7.3 07/01/2024   ALBUMIN 4.6 07/01/2024   LABGLOB 2.7 07/01/2024   BILITOT 0.5 07/01/2024   ALKPHOS 50 07/01/2024   AST 16 07/01/2024   ALT 12 07/01/2024   ANIONGAP 12 04/16/2023   Last lipids Lab Results  Component Value Date   CHOL 160 07/01/2024   HDL 54 07/01/2024   LDLCALC 73 07/01/2024   TRIG 199 (H) 07/01/2024   CHOLHDL 3.0 07/01/2024   Last hemoglobin A1c Lab Results  Component Value Date   HGBA1C 6.2 (H) 07/01/2024   Last thyroid functions No results found for: TSH, T3TOTAL, T4TOTAL, FREET4, THYROIDAB Last vitamin D  Lab Results  Component Value Date   VD25OH 62.8 07/01/2024   Last vitamin B12 and Folate No results found for: VITAMINB12, FOLATE    The  10-year ASCVD risk score (Arnett DK, et al., 2019) is: 35.9%    Assessment & Plan:   Problem List Items Addressed This Visit     Hyperlipidemia associated with type 2 diabetes mellitus (HCC)    Chronic Mixed hyperlipidemia with total cholesterol of 189 mg/dL, triglycerides at 764 mg/dL, HDL at 57 mg/dL, and LDL at 93 mg/dL. Currently on atorvastatin  10 mg daily.  - Order lipid panel - Continue atorvastatin  10 mg daily - Reinitiate fenofibrate  40 mg daily, Rx has been sent but does not appear pt has been filling this based on fill records  - Advise dietary modifications to reduce  triglycerides       Hypertension associated with diabetes (HCC)    Chronic, at goal Hypertension managed with losartan  25 mg twice daily. - Continue losartan  25 mg twice daily      Relevant Orders   Urine Albumin/Creatinine with ratio (send out) [[id number]]   Lipid panel   Hypertriglyceridemia   - Continue atorvastatin  10 mg daily -recommend that he continue fenofibrate  40 mg daily if TG levels are elevated above 150  - Rechecked lipid panel      Relevant Orders   Lipid panel   Spinal stenosis of lumbar region with neurogenic claudication   Lumbar spinal stenosis with neurogenic claudication Chronic condition with improvement through physical therapy and gabapentin . Continues physical therapy and exercises. - Continue gabapentin  300-600 mg at bedtime - Continue physical therapy - Continue tizanidine  4 mg every 8 hours as needed for muscle spasms -f/u with neurosurgery       Type 2 diabetes mellitus without complication, without long-term current use of insulin (HCC) - Primary   Chronic  Well-controlled with A1c at 6.2%. - Continue metformin  1000 mg twice daily - Continue Jardiance 10 mg daily      Relevant Orders   HgB A1c   Vitamin D  deficiency   Vitamin D  deficiency Vitamin D  level at 62, indicating well-controlled status. - Continue current management      Other Visit Diagnoses        Immunization due       Relevant Orders   Pneumococcal conjugate vaccine 20-valent     Screening for viral disease       Relevant Orders   Hepatitis C antibody     Dry mouth           Assessment and Plan Assessment & Plan General Health Maintenance  Routine wellness visit with well-controlled blood pressure at 132/72 mmHg and A1c at 6.2%, indicating good diabetes management. - Administered pneumococcal vaccine - Ordered hepatitis C screening - Referred to ophthalmology for diabetes eye exam - Collected urine albumin for diabetes - Updated A1c - Rechecked lipid panel    Dry mouth  Recommended OTC Biotene mouthwash    Sees Lonni Fairly in [place] for eye exam had in late 2025  Return in about 4 months (around 03/04/2025) for AWV.    Rockie Agent, MD [facility]   "

## 2024-11-04 NOTE — Assessment & Plan Note (Signed)
" °  Chronic, at goal Hypertension managed with losartan  25 mg twice daily. - Continue losartan  25 mg twice daily "

## 2024-11-04 NOTE — Assessment & Plan Note (Signed)
 Chronic  Well-controlled with A1c at 6.2%. - Continue metformin  1000 mg twice daily - Continue Jardiance 10 mg daily

## 2024-11-04 NOTE — Assessment & Plan Note (Addendum)
-   Continue atorvastatin  10 mg daily -recommend that he continue fenofibrate  40 mg daily if TG levels are elevated above 150  - Rechecked lipid panel

## 2024-11-04 NOTE — Progress Notes (Signed)
"  °  Subjective:  Patient ID: Jason Hammond, male    DOB: [birth year],  MRN: [id number]  Jason Hammond presents to clinic today for preventative diabetic foot care for painful mycotic toenails of both feet that are difficult to trim. Pain interferes with daily activities and wearing enclosed shoe gear comfortably.  Patient states he walks his dog twice daily walking a greater than one mile daily. He is also a caretaker of his wife. He is a former water engineer. Chief Complaint  Patient presents with   Diabetes    DFC. He saw Dr. Sharma in Sept. His A1c 6.2   New problem(s): None.   PCP is Simmons-Robinson, Makiera, MD.  Allergies[1]  Review of Systems: Negative except as noted in the HPI.  Objective: No changes noted in today's physical examination. There were no vitals filed for this visit. Jason Hammond is a pleasant 74 y.o. male WD, WN in NAD. AAO x 3.   Diabetic foot exam was performed with the following findings:   Vascular Examination: Capillary refill time immediate b/l. Vascular status intact b/l with palpable pedal pulses. Pedal hair present b/l. No pain with calf compression b/l. Skin temperature gradient WNL b/l. No cyanosis or clubbing b/l. No ischemia or gangrene noted b/l.   Neurological Examination: Sensation grossly intact b/l with 10 gram monofilament. Vibratory sensation intact b/l.   Dermatological Examination: Pedal skin with normal turgor, texture and tone b/l.  No open wounds. No interdigital macerations.   Toenails x 7  b/l thick, discolored, elongated with subungual debris and pain on dorsal palpation.   Nondystrophic toenails left third digit, left fourth digit, and left fifth digit.  No hyperkeratotic nor porokeratotic lesions.  Musculoskeletal Examination: Muscle strength 5/5 to all lower extremity muscle groups bilaterally. Pes planus deformity noted bilateral LE.SABRA No pain, crepitus or joint limitation noted with ROM b/l LE.  Patient  ambulates independently without assistive aids.  Radiographs: None  1. Pain due to onychomycosis of toenails of both feet   2. Type 2 diabetes mellitus without complication, without long-term current use of insulin (HCC)   Diabetic foot examination performed today. All patient's and/or POA's questions/concerns addressed on today's visit. Toenails 1-5 b/l debrided in length and girth without incident. Continue foot and shoe inspections daily. Monitor blood glucose per PCP/Endocrinologist's recommendations. Continue soft, supportive shoe gear daily. Report any pedal injuries to medical professional. Call office if there are any questions/concerns. -Patient/POA to call should there be question/concern in the interim.   Return in about 3 months (around 02/02/2025).  Jason Hammond, DPM      [facility] LOCATION: [address], [place] [address]                   Office [phone]   [facility] LOCATION: [address] [place], [place] [address] Office [phone]      [1] No Known Allergies  "

## 2024-11-04 NOTE — Assessment & Plan Note (Signed)
" °  Chronic Mixed hyperlipidemia with total cholesterol of 189 mg/dL, triglycerides at 764 mg/dL, HDL at 57 mg/dL, and LDL at 93 mg/dL. Currently on atorvastatin  10 mg daily.  - Order lipid panel - Continue atorvastatin  10 mg daily - Reinitiate fenofibrate  40 mg daily, Rx has been sent but does not appear pt has been filling this based on fill records  - Advise dietary modifications to reduce triglycerides  "

## 2024-11-04 NOTE — Assessment & Plan Note (Signed)
 Vitamin D  deficiency Vitamin D  level at 62, indicating well-controlled status. - Continue current management

## 2024-11-04 NOTE — Patient Instructions (Addendum)
 Biotene mouthwash for your dry mouth symptoms    To keep you healthy, please keep in mind the following health maintenance items that you are due for:   Health Maintenance Due  Topic Date Due   Medicare Annual Wellness (AWV)  Never done   OPHTHALMOLOGY EXAM  Never done   Hepatitis C Screening  Never done   DTaP/Tdap/Td (1 - Tdap) Never done   Pneumococcal Vaccine: 50+ Years (1 of 2 - PCV) Never done   Zoster Vaccines- Shingrix (1 of 2) Never done   COVID-19 Vaccine (5 - 2025-26 season) 06/27/2024   Diabetic kidney evaluation - Urine ACR  11/04/2024     Best Wishes,   Dr. Lang

## 2024-11-05 LAB — HEMOGLOBIN A1C
Est. average glucose Bld gHb Est-mCnc: 134 mg/dL
Hgb A1c MFr Bld: 6.3 % — ABNORMAL HIGH (ref 4.8–5.6)

## 2024-11-05 LAB — HEPATITIS C ANTIBODY: Hep C Virus Ab: NONREACTIVE

## 2024-11-05 LAB — LIPID PANEL
Chol/HDL Ratio: 4 ratio (ref 0.0–5.0)
Cholesterol, Total: 186 mg/dL (ref 100–199)
HDL: 46 mg/dL
LDL Chol Calc (NIH): 94 mg/dL (ref 0–99)
Triglycerides: 278 mg/dL — ABNORMAL HIGH (ref 0–149)
VLDL Cholesterol Cal: 46 mg/dL — ABNORMAL HIGH (ref 5–40)

## 2024-11-05 LAB — MICROALBUMIN / CREATININE URINE RATIO
Creatinine, Urine: 94.9 mg/dL
Microalb/Creat Ratio: 25 mg/g{creat} (ref 0–29)
Microalbumin, Urine: 24.1 ug/mL

## 2024-11-06 ENCOUNTER — Ambulatory Visit: Payer: Self-pay | Admitting: Family Medicine

## 2024-11-17 ENCOUNTER — Ambulatory Visit: Admitting: Family Medicine

## 2024-11-17 DIAGNOSIS — Z23 Encounter for immunization: Secondary | ICD-10-CM

## 2024-11-20 ENCOUNTER — Encounter: Payer: Self-pay | Admitting: Family Medicine

## 2024-11-20 NOTE — Progress Notes (Signed)
 Patient here for Pneumococcal vaccination only.  I did not examine the patient.  I did review patient's medical history, medications, and allergies and vaccine consent form.  CMA gave vaccination. Patient tolerated well.  Rockie Agent, MD  [facility]

## 2025-02-03 ENCOUNTER — Ambulatory Visit: Admitting: Podiatry

## 2025-03-08 ENCOUNTER — Encounter: Admitting: Family Medicine
